# Patient Record
Sex: Male | Born: 1975 | Race: White | Hispanic: No | Marital: Married | Smoking: Former smoker
Health system: Southern US, Community
[De-identification: ages and names within clinical notes are randomized; demographics above are authoritative.]

## PROBLEM LIST (undated history)

## (undated) DIAGNOSIS — N419 Inflammatory disease of prostate, unspecified: Principal | ICD-10-CM

## (undated) HISTORY — DX: Inflammatory disease of prostate, unspecified: N41.9

---

## 2004-06-10 ENCOUNTER — Ambulatory Visit: Payer: Self-pay | Admitting: Internal Medicine

## 2005-01-06 ENCOUNTER — Ambulatory Visit: Payer: Self-pay | Admitting: Internal Medicine

## 2006-11-23 ENCOUNTER — Ambulatory Visit: Payer: Self-pay | Admitting: Internal Medicine

## 2006-11-23 LAB — CONVERTED CEMR LAB
Blood in Urine, dipstick: NEGATIVE
Glucose, Urine, Semiquant: NEGATIVE
Ketones, urine, test strip: NEGATIVE
Protein, U semiquant: NEGATIVE
Urobilinogen, UA: 0.2
WBC Urine, dipstick: NEGATIVE

## 2006-12-22 ENCOUNTER — Ambulatory Visit: Payer: Self-pay | Admitting: Internal Medicine

## 2006-12-22 LAB — CONVERTED CEMR LAB
Cholesterol: 194 mg/dL (ref 0–200)
HDL: 29.9 mg/dL — ABNORMAL LOW (ref >39.0)
Total CHOL/HDL Ratio: 6.5
Triglycerides: 172 mg/dL — ABNORMAL HIGH (ref 0–149)

## 2007-03-09 ENCOUNTER — Ambulatory Visit: Payer: Self-pay | Admitting: Internal Medicine

## 2007-03-19 ENCOUNTER — Telehealth: Payer: Self-pay | Admitting: Family Medicine

## 2007-05-20 HISTORY — PX: NASAL SINUS SURGERY: SHX719

## 2007-05-30 ENCOUNTER — Ambulatory Visit: Payer: Self-pay | Admitting: Otolaryngology

## 2007-07-30 ENCOUNTER — Ambulatory Visit: Payer: Self-pay | Admitting: Internal Medicine

## 2009-05-12 ENCOUNTER — Ambulatory Visit: Payer: Self-pay | Admitting: Internal Medicine

## 2009-05-12 LAB — CONVERTED CEMR LAB
Blood in Urine, dipstick: NEGATIVE
Ketones, urine, test strip: NEGATIVE
Nitrite: NEGATIVE
Protein, U semiquant: NEGATIVE
WBC Urine, dipstick: NEGATIVE

## 2009-11-30 ENCOUNTER — Ambulatory Visit: Payer: Self-pay | Admitting: Internal Medicine

## 2010-02-18 ENCOUNTER — Ambulatory Visit: Payer: Self-pay | Admitting: Family Medicine

## 2010-02-18 DIAGNOSIS — J069 Acute upper respiratory infection, unspecified: Secondary | ICD-10-CM | POA: Insufficient documentation

## 2010-04-20 NOTE — Assessment & Plan Note (Signed)
Summary: SINUS/DLO   Vital Signs:  Patient profile:   35 year old male Weight:      204.75 pounds BMI:     26.38 Temp:     98.2 degrees F oral Pulse rate:   100 / minute Pulse rhythm:   regular BP sitting:   124 / 76  (left arm) Cuff size:   large  Vitals Entered By: Sydell Axon LPN (February 18, 2010 2:51 PM) CC: Ears feel stopped up and head congestion   History of Present Illness: Pt here for congestiuon. Pt had chills yesyterday, didn't chjeck his temp. he has headache occipitally, some ear pain today bilaterally, no rhinitis, ST esp in Am, cough productive thick yellow...worst in the AM.  No N/V, sleeping poorly not from cough. He has taken Tyl, nothing else.   Problems Prior to Update: 1)  Sinusitis- Acute-nos  (ICD-461.9) 2)  Screening For Diabetes Mellitus  (ICD-V77.1) 3)  Screening For Lipoid Disorders  (ICD-V77.91) 4)  Examination, Routine Medical  (ICD-V70.0)  Medications Prior to Update: 1)  Bactrim Ds 800-160 Mg Tabs (Sulfamethoxazole-Trimethoprim) .... One By Mouth Two Times A Day X 10 Days 2)  Flonase 50 Mcg/act Susp (Fluticasone Propionate) .... 2 Squirts in Each Nostril Daily  Allergies: 1)  ! Augmentin  Physical Exam  General:  alert and normal appearance.  Mildly sunburned. Head:  Normocephalic and atraumatic without obvious abnormalities. No apparent alopecia or balding.  + max sinus tenderness Eyes:  Conjunctiva clear bilaterally.  Ears:  R ear normal and L ear normal.   Nose:  External nasal examination shows no deformity or inflammation. Nasal mucosa are pink and moist without lesions or exudates. Mouth:  + erythema pharynx, no exudates Neck:  supple, no masses, no thyromegaly, no carotid bruits, and no cervical lymphadenopathy.   Lungs:  Normal respiratory effort, chest expands symmetrically. Lungs are clear to auscultation, no crackles or wheezes. Heart:  normal rate, regular rhythm, no murmur, and no gallop.     Impression &  Recommendations:  Problem # 1:  URI (ICD-465.9) Assessment New  See instructions.  Instructed on symptomatic treatment. Call if symptoms persist or worsen.   Complete Medication List: 1)  Flonase 50 Mcg/act Susp (Fluticasone propionate) .... 2 squirts in each nostril daily 2)  Doxycycline Hyclate 100 Mg Caps (Doxycycline hyclate) .... One tab by mouth two times a day  Patient Instructions: 1)  Take Guaifenesin by [facility] or [facility] and getting MUCOUS RELIEF EXPECTORANT (400mg ), take 11/2 tabs by mouth AM and NOON. 2)  Drink lots of fluids anytime taking Guaifenesin.  3)  Take Tyl ES 2 tabs by mouth three times a day.  4)  Gargle with nwarm salt water every 1/2 hr for two days. 5)  If sxs worsen, take Doxycycline. Prescriptions: DOXYCYCLINE HYCLATE 100 MG CAPS (DOXYCYCLINE HYCLATE) one tab by mouth two times a day  #20 x 0   Entered and Authorized by:   Shaune Leeks MD   Signed by:   Shaune Leeks MD on 02/18/2010   Method used:   Print then Give to Patient   RxID:   [id number]    Orders Added: 1)  Est. Patient Level III [28413]    Current Allergies (reviewed today): ! AUGMENTIN

## 2010-04-20 NOTE — Assessment & Plan Note (Signed)
Summary: ?SINUS INFECTION/CLE   Vital Signs:  Patient profile:   35 year old male Weight:      196 pounds Temp:     98.8 degrees F oral Pulse rate:   84 / minute Pulse rhythm:   regular BP sitting:   120 / 80  (left arm) Cuff size:   large  Vitals Entered By: Selena Batten Dance CMA Duncan Dull) (November 30, 2009 4:24 PM) CC: ? sinus infection x2 weeks   History of Present Illness: CC: 2wk h/o sinus infx?  2wk h/o purulent discharge and coughing, small amt blood in mucous.  Coughing in AM.  Feeling feverish on and off.  Started as Charity fundraiser, congestion, ST.  Tried OTC pseudophedrine (didn't really help).  + tooth pain bilaterally.  + sinus pressure headache, muffled ears and worse with bending head.  + wife sick, 3yo starting to get sick.  No new pets.  + smoking 1ppd.   No abd pain, n/v/d.    Current Medications (verified): 1)  None  Allergies: 1)  ! Augmentin PMH-FH-SH reviewed for relevance  Review of Systems       per HPI  Physical Exam  General:  alert and normal appearance.  sunburn Head:  Normocephalic and atraumatic without obvious abnormalities. No apparent alopecia or balding.  + max tenderness Eyes:  PERRLA, EOMI, + conjunctiva injected Ears:  R ear normal and L ear normal.   Nose:  + purulent discharge Mouth:  + erythema pharynx, no exudates Neck:  supple, no masses, no thyromegaly, no carotid bruits, and no cervical lymphadenopathy.   Lungs:  normal respiratory effort and normal breath sounds.  coarse breath sounds Heart:  normal rate, regular rhythm, no murmur, and no gallop.   Pulses:  2+ rad pulses Extremities:  no edema   Impression & Recommendations:  Problem # 1:  SINUSITIS- ACUTE-NOS (ICD-461.9) Instructed on treatment. Call if symptoms persist or worsen. augmentin allergy - rash  His updated medication list for this problem includes:    Bactrim Ds 800-160 Mg Tabs (Sulfamethoxazole-trimethoprim) ..... One by mouth two times a day x 10 days    Flonase 50  Mcg/act Susp (Fluticasone propionate) .Marland Kitchen... 2 squirts in each nostril daily  Complete Medication List: 1)  Bactrim Ds 800-160 Mg Tabs (Sulfamethoxazole-trimethoprim) .... One by mouth two times a day x 10 days 2)  Flonase 50 Mcg/act Susp (Fluticasone propionate) .... 2 squirts in each nostril daily  Patient Instructions: 1)  Sinus infection. 2)  Take antibotics as prescribed. 3)  Nasal saline drops and nasal steroid to help with inflammation in sinuses. 4)  Call clinic with quesitons, pleasure to meet you today. Prescriptions: FLONASE 50 MCG/ACT SUSP (FLUTICASONE PROPIONATE) 2 squirts in each nostril daily  #1 x 1   Entered and Authorized by:   Eustaquio Boyden  MD   Signed by:   Eustaquio Boyden  MD on 11/30/2009   Method used:   [facility]       [address]       [place], [place]  [address]       Ph: [phone] or [phone]       Fax: [phone]   RxID:   [id number] BACTRIM DS 800-160 MG TABS (SULFAMETHOXAZOLE-TRIMETHOPRIM) one by mouth two times a day x 10 days  #20 x 0   Entered and Authorized by:   Eustaquio Boyden  MD   Signed by:   Eustaquio Boyden  MD on 11/30/2009   Method used:   [facility]        [facility] ([facility]ail[facility]       [address]       [place], [place]  [address]       Ph: [phone] or [phone]       Fax: [phone]   RxID:   [id number]   Prior Medications: Current Allergies (reviewed today): ! AUGMENTIN

## 2010-04-20 NOTE — Assessment & Plan Note (Signed)
Summary: CDL PHYSICAL/CLE   Vital Signs:  Patient profile:   35 year old male Height:      74 inches Weight:      201 pounds BMI:     25.90 Temp:     98.2 degrees F oral Pulse rate:   88 / minute Pulse rhythm:   regular BP sitting:   150 / 90  (left arm) Cuff size:   large  Vitals Entered By: Mervin Hack CMA Duncan Dull) (May 12, 2009 11:20 AM)  Serial Vital Signs/Assessments:  Time      Position  BP       Pulse  Resp  Temp     By           R Arm     140/84                         Cindee Salt MD  CC: CDL physical  Vision Screening:Left eye with correction: 20 / 25 Right eye with correction: 20 / 20 Both eyes with correction: 20 / 20        Vision Entered By: Mervin Hack CMA Duncan Dull) (May 12, 2009 11:21 AM)   History of Present Illness: Here for CDL exam no new concerns  Allergies: 1)  ! Augmentin  Past History:  Past medical, surgical, family and social histories (including risk factors) reviewed for relevance to current acute and chronic problems.  Past Medical History: Reviewed history from 11/23/2006 and no changes required. Unremarkable  Past Surgical History: Reviewed history from 07/30/2007 and no changes required. 3/09 Sinus surgery  Family History: Reviewed history from 09/07/2006 and no changes required. Parents healthy (divorced) 3 step-sisters DM in aunt Prostate cancer in [place] GF  Social History: Reviewed history from 07/30/2007 and no changes required. 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 Eyes:  Denies double vision and vision loss-1 eye. ENT:  Denies decreased hearing and ringing in ears. CV:  Denies chest pain or discomfort, difficulty breathing at night, difficulty breathing while lying down, fainting,  lightheadness, palpitations, and shortness of breath with exertion. Resp:  Denies cough and shortness of breath. GI:  Denies abdominal pain, change in bowel habits, dark tarry stools, indigestion, nausea, and vomiting. GU:  Denies erectile dysfunction, urinary frequency, and urinary hesitancy. MS:  Complains of joint pain; denies joint swelling; Occ wrist pain--relates to holding machinery he uses. Derm:  Denies lesion(s) and rash. Neuro:  Complains of tingling; denies headaches, numbness, and weakness; occ awakens with right arm tingling if he sleeps on it wrong. Psych:  Denies anxiety and depression. Heme:  Denies abnormal bruising and enlarge lymph nodes. Allergy:  Denies seasonal allergies and sneezing.  Physical Exam  General:  alert and normal appearance.   Eyes:  pupils equal, pupils round, pupils reactive to light, and no optic disk abnormalities.   Ears:  R ear normal and L ear normal.   Mouth:  no erythema and no exudates.   Neck:  supple, no masses, no thyromegaly, no carotid bruits, and no cervical lymphadenopathy.   Lungs:  normal respiratory effort and normal breath sounds.   Heart:  normal rate, regular rhythm, no murmur, and no gallop.   Abdomen:  soft, non-tender, and no inguinal hernia.   Genitalia:  varicocele bilat normal testes Msk:  no joint tenderness and no joint swelling.   Pulses:  normal in feet Extremities:  no edema Neurologic:  alert & oriented X3, strength normal in all extremities, and gait normal.   Skin:  no rashes and no suspicious lesions.   Multiple benign nevi Axillary Nodes:  No palpable lymphadenopathy Psych:  normally interactive, good eye contact, not anxious appearing, and not depressed appearing.     Impression & Recommendations:  Problem # 1:  EXAMINATION, ROUTINE MEDICAL (ICD-V70.0) Assessment Comment Only healthy repeat BP 140/84 CDL signed for 2 years not fasting---will check glucose again next time  discussed cigarette  cessation--suggested patch and lozenge/gum  Patient Instructions: 1)  Please schedule a follow-up appointment in 2 years.   Prior Medications: Current Allergies (reviewed today): ! AUGMENTIN  Laboratory Results   Urine Tests  Date/Time Received: May 12, 2009 11:30 AM Date/Time Reported: May 12, 2009 11:30 AM  Routine Urinalysis   Color: yellow Appearance: Hazy Glucose: negative   (Normal Range: Negative) Bilirubin: negative   (Normal Range: Negative) Ketone: negative   (Normal Range: Negative) Spec. Gravity: <1.005   (Normal Range: 1.003-1.035) Blood: negative   (Normal Range: Negative) pH: 8.0   (Normal Range: 5.0-8.0) Protein: negative   (Normal Range: Negative) Urobilinogen: 0.2   (Normal Range: 0-1) Nitrite: negative   (Normal Range: Negative) Leukocyte Esterace: negative   (Normal Range: Negative)       Prevention & Chronic Care Immunizations   Influenza vaccine: Not documented    Tetanus booster: 03/21/1998: Td    Pneumococcal vaccine: Not documented  Other Screening   Smoking status: current  (09/07/2006)  Lipids   Total Cholesterol: 194  (12/22/2006)   LDL: 130  (12/22/2006)   LDL Direct: Not documented   HDL: 29.9  (12/22/2006)   Triglycerides: 172  (12/22/2006)

## 2011-02-23 ENCOUNTER — Encounter: Payer: Self-pay | Admitting: Internal Medicine

## 2011-02-24 ENCOUNTER — Ambulatory Visit (INDEPENDENT_AMBULATORY_CARE_PROVIDER_SITE_OTHER): Payer: BC Managed Care – PPO | Admitting: Family Medicine

## 2011-02-24 ENCOUNTER — Encounter: Payer: Self-pay | Admitting: Family Medicine

## 2011-02-24 VITALS — BP 110/80 | HR 96 | Temp 98.6°F | Ht 74.0 in | Wt 208.8 lb

## 2011-02-24 DIAGNOSIS — R05 Cough: Secondary | ICD-10-CM

## 2011-02-24 DIAGNOSIS — R6889 Other general symptoms and signs: Secondary | ICD-10-CM

## 2011-02-24 DIAGNOSIS — R5381 Other malaise: Secondary | ICD-10-CM

## 2011-02-24 DIAGNOSIS — J111 Influenza due to unidentified influenza virus with other respiratory manifestations: Secondary | ICD-10-CM | POA: Insufficient documentation

## 2011-02-24 DIAGNOSIS — R059 Cough, unspecified: Secondary | ICD-10-CM

## 2011-02-24 DIAGNOSIS — R5383 Other fatigue: Secondary | ICD-10-CM

## 2011-02-24 NOTE — Patient Instructions (Signed)
I think you have flu like illness. Antibiotics are not needed for this.  Viral infections usually take 7-10 days to resolve.  The cough can last several weeks to go away. Use cheratussin for cough as needed. Push fluids and plenty of rest. Out of work until feeling better. Please return if you are not improving as expected, or if you have high fevers (>101.5) or difficulty swallowing or worsening productive cough. Call clinic with questions.  Good to see you today.

## 2011-02-24 NOTE — Assessment & Plan Note (Signed)
Flu like illness - out of work. Supportive care as per instructions. Update [place] if sxs not improving or red flags.

## 2011-02-24 NOTE — Progress Notes (Signed)
  Subjective:    Patient ID: Jeffrey Cross, male    DOB: Oct 25, 1975, 35 y.o.   MRN: [id number]  HPI CC: congestion  3d h/o myalgias, body aches, throbbing HA, fatigue.  + cough mild productive yellow sputum.  RN of clear mucous, congestion.    So far has tried ibuprofen and theraflu which didn't help.  Temp to 99.9 yesterday.  No abd pain, n/v/d, rashes.  Wife recently sick.  Smoking 1 1/2 ppd, has cut back some.  No asthma, COPD.  Review of Systems Per HPI    Objective:   Physical Exam  Nursing note and vitals reviewed. Constitutional: He appears well-developed and well-nourished. No distress.       Evidently congested  HENT:  Head: Normocephalic and atraumatic.  Right Ear: Hearing, tympanic membrane, external ear and ear canal normal.  Left Ear: Hearing, tympanic membrane, external ear and ear canal normal.  Nose: Nose normal. No mucosal edema or rhinorrhea. Right sinus exhibits no maxillary sinus tenderness and no frontal sinus tenderness. Left sinus exhibits no maxillary sinus tenderness and no frontal sinus tenderness.  Mouth/Throat: Uvula is midline, oropharynx is clear and moist and mucous membranes are normal. No oropharyngeal exudate, posterior oropharyngeal edema, posterior oropharyngeal erythema or tonsillar abscesses.       R>L TM congestion  Eyes: Conjunctivae and EOM are normal. Pupils are equal, round, and reactive to light. No scleral icterus.  Neck: Normal range of motion. Neck supple. No thyromegaly present.  Cardiovascular: Normal rate, regular rhythm, normal heart sounds and intact distal pulses.   No murmur heard. Pulmonary/Chest: Effort normal and breath sounds normal. No respiratory distress. He has no wheezes. He has no rales.  Lymphadenopathy:    He has no cervical adenopathy.  Skin: Skin is warm and dry. No rash noted.       Assessment & Plan:

## 2011-05-23 ENCOUNTER — Encounter: Payer: BC Managed Care – PPO | Admitting: Internal Medicine

## 2011-05-26 ENCOUNTER — Ambulatory Visit (INDEPENDENT_AMBULATORY_CARE_PROVIDER_SITE_OTHER): Payer: BC Managed Care – PPO | Admitting: Internal Medicine

## 2011-05-26 ENCOUNTER — Encounter: Payer: Self-pay | Admitting: Internal Medicine

## 2011-05-26 VITALS — BP 130/82 | HR 90 | Temp 98.6°F | Ht 74.0 in | Wt 216.0 lb

## 2011-05-26 DIAGNOSIS — J209 Acute bronchitis, unspecified: Secondary | ICD-10-CM

## 2011-05-26 DIAGNOSIS — Z0000 Encounter for general adult medical examination without abnormal findings: Secondary | ICD-10-CM | POA: Insufficient documentation

## 2011-05-26 DIAGNOSIS — Z3009 Encounter for other general counseling and advice on contraception: Secondary | ICD-10-CM

## 2011-05-26 LAB — POCT URINALYSIS DIPSTICK
Bilirubin, UA: NEGATIVE
Blood, UA: NEGATIVE
Glucose, UA: NEGATIVE
Nitrite, UA: NEGATIVE
Spec Grav, UA: <=1.005
Urobilinogen, UA: 0.2

## 2011-05-26 MED ORDER — AZITHROMYCIN 250 MG PO TABS: ORAL_TABLET | ORAL | Status: AC

## 2011-05-26 NOTE — Progress Notes (Signed)
Subjective:    Patient ID: Jeffrey Cross, male    DOB: Jul 20, 1975, 36 y.o.   MRN: [id number]  HPI Doing well Needs CDL Didn't realize he was gaining weight  Just got new contacts  Wants to stop smoking 1-1.5 PPD Gave info on 1-800 QUIT NOW Bad dreams with nicotine patch---discussed taking off at bedtime  No current outpatient prescriptions on file prior to visit.    Allergies  Allergen Reactions  . Augmentin     rash    Past Medical History  Diagnosis Date  . No pertinent past medical history     Past Surgical History  Procedure Date  . Nasal sinus surgery 3/09    Family History  Problem Relation Age of Onset  . Healthy Mother   . Healthy Father   . Diabetes      Aunt  . Prostate cancer Paternal Grandfather     History   Social History  . Marital Status: Married    Spouse Name: N/A    Number of Children: 2  . Years of Education: N/A   Occupational History  . Plumber    Social History Main Topics  . Smoking status: Current Everyday Smoker -- 1.5 packs/day    Types: Cigarettes  . Smokeless tobacco: Never Used  . Alcohol Use: Yes     Rare  . Drug Use: No  . Sexually Active: Not on file   Other Topics Concern  . Not on file   Social History Narrative   Married; 2 children--1 with wife and one with shared custody with the motherPlumber--needs CDL for overweight vehicle   Review of Systems  Constitutional: Positive for unexpected weight change. Negative for fatigue.       Wears seat belt  HENT: Negative for hearing loss, congestion, rhinorrhea, dental problem and tinnitus.        Regular with dentist   Eyes: Negative for visual disturbance.       No diplopia or unilateral vision loss  Respiratory: Positive for cough. Negative for chest tightness and shortness of breath.        Recent cough with bronchitis Over a week Yellow sputum  Cardiovascular: Negative for chest pain, palpitations and leg swelling.  Gastrointestinal: Negative for  nausea, vomiting, abdominal pain, constipation and blood in stool.       Occ heartburn--doesn't use meds  Genitourinary: Negative for dysuria, urgency, frequency and difficulty urinating.       No sexual problems  Musculoskeletal: Positive for back pain. Negative for joint swelling and arthralgias.       Occ mild back pain that is self limited  Skin: Negative for rash.       Gets subQ masses---?lipomas  Neurological: Negative for dizziness, syncope, weakness, light-headedness, numbness and headaches.       Some warm sensation on lateral right thigh (?meralgia paresthetica)  Hematological: Negative for adenopathy. Does not bruise/bleed easily.  Psychiatric/Behavioral: Negative for sleep disturbance and dysphoric mood. The patient is not nervous/anxious.        Objective:   Physical Exam  Constitutional: He is oriented to person, place, and time. He appears well-developed and well-nourished. No distress.  HENT:  Head: Normocephalic and atraumatic.  Right Ear: External ear normal.  Left Ear: External ear normal.  Mouth/Throat: Oropharynx is clear and moist. No oropharyngeal exudate.  Eyes: Conjunctivae and EOM are normal. Pupils are equal, round, and reactive to light.  Neck: Normal range of motion. Neck supple. No thyromegaly present.  Cardiovascular: Normal  rate, regular rhythm, normal heart sounds and intact distal pulses.  Exam reveals no gallop.   No murmur heard. Pulmonary/Chest: Effort normal and breath sounds normal. No respiratory distress. He has no wheezes. He has no rales.  Abdominal: Soft. There is no tenderness.  Musculoskeletal: He exhibits no edema and no tenderness.  Lymphadenopathy:    He has no cervical adenopathy.  Neurological: He is alert and oriented to person, place, and time.  Skin: No rash noted. No erythema.  Psychiatric: He has a normal mood and affect. His behavior is normal. Judgment and thought content normal.          Assessment & Plan:

## 2011-05-26 NOTE — Assessment & Plan Note (Signed)
Healthy CDL done Discussed fitness and weight Counseled on cigarette cessation

## 2011-05-26 NOTE — Assessment & Plan Note (Signed)
Persistent cough with purulent mucous Will treat with z-pak

## 2011-07-28 ENCOUNTER — Encounter: Payer: Self-pay | Admitting: Internal Medicine

## 2011-07-28 ENCOUNTER — Ambulatory Visit (INDEPENDENT_AMBULATORY_CARE_PROVIDER_SITE_OTHER): Payer: BC Managed Care – PPO | Admitting: Internal Medicine

## 2011-07-28 VITALS — BP 120/80 | HR 95 | Temp 98.0°F | Wt 205.0 lb

## 2011-07-28 DIAGNOSIS — K612 Anorectal abscess: Secondary | ICD-10-CM

## 2011-07-28 DIAGNOSIS — K611 Rectal abscess: Secondary | ICD-10-CM | POA: Insufficient documentation

## 2011-07-28 MED ORDER — CEPHALEXIN 500 MG PO TABS: 500.0000 mg | ORAL_TABLET | Freq: Four times a day (QID) | ORAL | Status: AC

## 2011-07-28 NOTE — Progress Notes (Signed)
  Subjective:    Patient ID: Jeffrey Cross, male    DOB: September 19, 1975, 36 y.o.   MRN: [id number]  HPI Having a lot of pain in rectal area Mildly 2 days ago then mostly yesterday  Had vasectomy Friday---that feels fine Called urologist---referred here  No lumps or masses No blood Constant pain Tuck's pads didn't help at all  Bowels have been regular but it is very painful Usually doesn't have trouble moving bowels  Ibuprofen not really helping  Using hand held excavator Lots of lifting on Monday  No current outpatient prescriptions on file prior to visit.    Allergies  Allergen Reactions  . Amoxicillin-Pot Clavulanate     rash    Past Medical History  Diagnosis Date  . No pertinent past medical history     Past Surgical History  Procedure Date  . Nasal sinus surgery 3/09    Family History  Problem Relation Age of Onset  . Healthy Mother   . Healthy Father   . Diabetes      Aunt  . Prostate cancer Paternal Grandfather     History   Social History  . Marital Status: Married    Spouse Name: N/A    Number of Children: 2  . Years of Education: N/A   Occupational History  . Plumber    Social History Main Topics  . Smoking status: Former Smoker -- 1.5 packs/day    Types: Cigarettes    Quit date: 06/28/2011  . Smokeless tobacco: Never Used  . Alcohol Use: Yes     Rare  . Drug Use: No  . Sexually Active: Not on file   Other Topics Concern  . Not on file   Social History Narrative   Married; 2 children--1 with wife and one with shared custody with the motherPlumber--needs CDL for overweight vehicle   Review of Systems Appetite is okay No nausea or vomiting    Objective:   Physical Exam  Constitutional: He appears well-developed and well-nourished. No distress.  Genitourinary:       Scrotum looks fine No tenderness or bruising  Indurated area on left side of rectum Some pus coming out Exquisitely tender          Assessment & Plan:

## 2011-07-28 NOTE — Assessment & Plan Note (Addendum)
Not sure how he got this May have had skin break when positioned for the vasectomy No evidence of fistula  Discussed hot tub tonight since starting to drain now Keflex

## 2011-09-19 DIAGNOSIS — N419 Inflammatory disease of prostate, unspecified: Secondary | ICD-10-CM

## 2011-09-19 HISTORY — DX: Inflammatory disease of prostate, unspecified: N41.9

## 2011-09-26 ENCOUNTER — Ambulatory Visit (INDEPENDENT_AMBULATORY_CARE_PROVIDER_SITE_OTHER): Payer: BC Managed Care – PPO | Admitting: Family Medicine

## 2011-09-26 ENCOUNTER — Encounter: Payer: Self-pay | Admitting: Family Medicine

## 2011-09-26 VITALS — BP 136/70 | HR 80 | Temp 98.1°F | Wt 208.0 lb

## 2011-09-26 DIAGNOSIS — N419 Inflammatory disease of prostate, unspecified: Secondary | ICD-10-CM | POA: Insufficient documentation

## 2011-09-26 DIAGNOSIS — R300 Dysuria: Secondary | ICD-10-CM

## 2011-09-26 LAB — POCT URINALYSIS DIPSTICK
Glucose, UA: NEGATIVE
Spec Grav, UA: >=1.03
Urobilinogen, UA: 0.2

## 2011-09-26 MED ORDER — SULFAMETHOXAZOLE-TRIMETHOPRIM 800-160 MG PO TABS: 1.0000 | ORAL_TABLET | Freq: Two times a day (BID) | ORAL | Status: AC

## 2011-09-26 NOTE — Assessment & Plan Note (Signed)
Exam, sxs, and UA/micro consistent with prostatitis. Discussed this as well as provided with handout. Treat with bactrim x 20d course. (avoid cipro given highly active work). UCx sent.

## 2011-09-26 NOTE — Patient Instructions (Signed)
I do think there's prostate infection Treat with bactrim twice daily for 20 days. Let us know if not improving after treatment.  Prostatitis Prostatitis is an inflammation (the body's way of reacting to injury and/or infection) of the prostate gland. The prostate gland is a male organ. The gland is about the size and shape of a walnut. The prostate is located just below the bladder. It produces semen, which is a fluid that helps nourish and transport sperm. Prostatitis is the most common urinary tract problem in men younger than age 72. There are 4 categories of prostatitis:  I - Acute bacterial prostatitis.   II - Chronic bacterial prostatitis.   III - Chronic prostatitis and chronic pelvic pain syndrome (CPPS).   Inflammatory.   Non inflammatory.   IV - Asymptomatic inflammatory prostatitis.  Acute and chronic bacterial prostatitis are problems with bacterial infections of the prostate. "Acute" infection is usually a one-time problem. "Chronic" bacterial prostatitis is a condition with recurrent infection. It is usually caused by the same germ(bacteria). CPPS has symptoms similar to prostate infection. However, no infection is actually found. This condition can cause problems of ongoing pain. Currently, it cannot be cured. Treatments are available and aimed at symptom control.  Asymptomatic inflammatory prostatitis has no symptoms. It is a condition where infection-fighting cells are found by chance in the urine. The diagnosis is made most often during an exam for other conditions. Other conditions could be infertility or a high level of PSA (prostate-specific antigen) in the blood. SYMPTOMS  Symptoms can vary depending upon the type of prostatitis that exists. There can also be overlap in symptoms. This can make diagnosis difficult. Symptoms: For Acute bacterial prostatitis  Painful urination.   Fever or chills.   Muscle or joint pains.   Low back pain.   Low abdominal pain.    Inability to empty bladder completely.   Sudden urges to urinate.   Frequent urination during the day.   Difficulty starting urine stream.   Need to urinate several times at night (nocturia).   Weak urine stream.   Urethral (tube that carries urine from the bladder out of the body) discharge and dribbling after urination.  For Chronic bacterial prostatitis  Rectal pain.   Pain in the testicles, penis, or tip of the penis.   Pain in the space between the anus and scrotum (perineum).   Low back pain.   Low abdominal pain.   Problems with sexual function.   Painful ejaculation.   Bloody semen.   Inability to empty bladder completely.   Painful urination.   Sudden urges to urinate.   Frequent urination during the day.   Difficulty starting urine stream.   Need to urinate several times at night (nocturia).   Weak urine stream.   Dribbling after urination.   Urethral discharge.  For Chronic prostatitis and chronic pelvic pain syndrome (CPPS) Symptoms are the same as those for chronic bacterial prostatitis. Problems with sexual function are often the reason for seeking care. This important problem should be discussed with your caregiver. For Asymptomatic inflammatory prostatitis As noted above, there are no symptoms with this condition. DIAGNOSIS   Your caregiver may perform a rectal exam. This exam is to determine if the prostate is swollen and tender.   Sometimes blood work is performed. This is done to see if your white blood cell count is elevated. The Prostate Specific Antigen (PSA) is also measured. PSA is a blood test that can help detect early prostate  cancer.   A urinalysis is done to find out what type of infection is present if this is a suspected cause. An additional urinalysis may be done after a digital rectal exam. This is to see if white blood cells are pushed out of the prostate and into the urine. A low-grade infection of the prostate may not  be found on the first urinalysis.  In more difficult cases, your caregiver may advise other tests. Tests could include:  Urodynamics -- Tests the function of the bladder and the organs involved in triggering and controlling normal urination.   Urine flow rate.   Cystoscopy -- In this procedure, a thin, telescope-like tube with a light and tiny camera attached (cystoscope) is inserted into the bladder through the urethra. This allows the caregiver to see the inside of the urethra and bladder.   Electromyography -- This procedure tests how the muscles and nerves of the bladder work. It is focused on the muscles that control the anus and pelvic floor. These are the muscles between the anus and scrotum.  In people who show no signs of infection, certain uncommon infections might be causing constant or recurrent symptoms. These uncommon infections are difficult to detect. More work in medicine may help find solutions to these problems. TREATMENT  Antibiotics are used to treat infections caused by germs. If the infection is not treated and becomes long lasting (chronic), it may become a lower grade infection with minor, continual problems. Without treatment, the prostate may develop a boil or furuncle (abscess). This may require surgical treatment. For those with chronic prostatitis and CPPS, it is important to work closely with your primary caregiver and urologist. For some, the medicines that are used to treat a non-cancerous, enlarged prostate (benign prostatic hypertrophy) may be helpful. Referrals to specialists other than urologists may be necessary. In rare cases when all treatments have been inadequate for pain control, an operation to remove the prostate may be recommended. This is very rare and before this is considered thorough discussion with your urologist is highly recommended.  In cases of secondary to chronic non-bacterial prostatitis, a good relationship with your urologist or primary  caregiver is essential because it is often a recurrent prolonged condition that requires a good understanding of the causes and a commitment to therapy aimed at controlling your symptoms. HOME CARE INSTRUCTIONS   Hot sitz baths for 20 minutes, 4 times per day, may help relieve pain.   Non-prescription pain killers may be used as your caregiver recommends if you have no allergies to them. Some illnesses or conditions prevent use of non-prescription drugs. If unsure, check with your caregiver. Take all medications as directed. Take the antibiotics for the prescribed length of time, even if you are feeling better.  SEEK MEDICAL CARE IF:   You have any worsening of the symptoms that originally brought you to your caregiver.   You have an oral temperature above 102 F (38.9 C).   You experience any side effects from medications prescribed.  SEEK IMMEDIATE MEDICAL CARE IF:   You have an oral temperature above 102 F (38.9 C), not controlled by medicine.   You have pain not relieved with medications.   You develop nausea, vomiting, lightheadedness, or have a fainting episode.   You are unable to urinate.   You pass bloody urine or clots.  Document Released: 03/04/2000 Document Revised: 02/24/2011 Document Reviewed: 02/07/2011 [facility] Patient Information 2012 [place], [place].

## 2011-09-26 NOTE — Progress Notes (Signed)
  Subjective:    Patient ID: Jeffrey Cross, male    DOB: 02-Apr-1975, 36 y.o.   MRN: [id number]  HPI CC: ?UTI  3d h/o dysuria, trouble with stream.  Some polyuria, some retention.  First night with chills, night sweats, malaise.  Drank a lot of iced tea prior to sxs starting.  Took 4 keflex pills had left over.  No urgency.  No hematuria.  Some lower back pain and leg pain.  Has never had prostate infection in past.  Denies abd pain, n/v, flank pain.  No urethral discharge.  No new sexual partners.    Recent perirectal abscess 07/2011 treated with keflex, drained on own.  Resolved.    Had vasectomy 06/2011 by urology.  Past Medical History  Diagnosis Date  . No pertinent past medical history     Review of Systems Per HPI    Objective:   Physical Exam  Nursing note and vitals reviewed. Constitutional: He appears well-developed and well-nourished. No distress.  Abdominal: Soft. Bowel sounds are normal. He exhibits no distension. There is no hepatosplenomegaly. There is no tenderness. There is no rebound, no guarding and no CVA tenderness.  Genitourinary: Rectum normal. Rectal exam shows no external hemorrhoid, no internal hemorrhoid, no fissure, no mass, no tenderness and anal tone normal. Prostate is enlarged (slightly boggy) and tender.      Assessment & Plan:

## 2011-09-29 LAB — URINE CULTURE: Colony Count: >=100000

## 2011-11-18 ENCOUNTER — Encounter: Payer: Self-pay | Admitting: Family Medicine

## 2011-11-18 ENCOUNTER — Ambulatory Visit (INDEPENDENT_AMBULATORY_CARE_PROVIDER_SITE_OTHER): Payer: BC Managed Care – PPO | Admitting: Family Medicine

## 2011-11-18 VITALS — BP 126/88 | HR 78 | Temp 98.3°F | Wt 209.8 lb

## 2011-11-18 DIAGNOSIS — H6590 Unspecified nonsuppurative otitis media, unspecified ear: Secondary | ICD-10-CM

## 2011-11-18 MED ORDER — AZITHROMYCIN 250 MG PO TABS: ORAL_TABLET | ORAL | Status: AC

## 2011-11-18 MED ORDER — FLUTICASONE PROPIONATE 50 MCG/ACT NA SUSP: 2.0000 | Freq: Every day | NASAL | Status: DC

## 2011-11-18 NOTE — Progress Notes (Signed)
  Subjective:    Patient ID: Jeffrey Cross, male    DOB: 1975/09/24, 36 y.o.   MRN: [id number]  HPI CC: R ear pain  sxs ongoing for last 2-3 days.  "R ear closed up".  Recently getting over cold.  Still mild cough productive of green sputum.    Mild pain at right jaw.  + R tinnitus.  No pain, fevers/chills.  Has tried ibuprofen OTC.  No recent swimming.  No discharge from ears.  Review of Systems Per HPI    Objective:   Physical Exam  Nursing note and vitals reviewed. Constitutional: He appears well-developed and well-nourished. No distress.  HENT:  Head: Normocephalic and atraumatic.  Right Ear: Hearing, external ear and ear canal normal.  Left Ear: Hearing, tympanic membrane, external ear and ear canal normal.  Nose: Mucosal edema present. No rhinorrhea.  Mouth/Throat: Uvula is midline, oropharynx is clear and moist and mucous membranes are normal. No oropharyngeal exudate, posterior oropharyngeal edema, posterior oropharyngeal erythema or tonsillar abscesses.       yellow fluid behind R TM, poor mobility with insufflation, telangectasia and erythema of TM.  Eyes: Conjunctivae and EOM are normal. Pupils are equal, round, and reactive to light. No scleral icterus.  Neck: Normal range of motion. Neck supple.  Lymphadenopathy:    He has no cervical adenopathy.       Assessment & Plan:

## 2011-11-18 NOTE — Assessment & Plan Note (Signed)
With eustachian tube dysfunction on right side after viral URTI. Supportive care with flonase, nasal saline. If not improving or worsening, fill zpack. Discussed if not improved in 3-4 wks, return for re eval, consideration of ENT.

## 2011-11-18 NOTE — Patient Instructions (Signed)
You have serous otitis - treat with nasal saline and nasal steroid. Fill zpack if not improving or any worsening. Push fluids and rest.  Serous Otitis Media  Serous otitis media is also known as otitis media with effusion (OME). It means there is fluid in the middle ear space. This space contains the bones for hearing and air. Air in the middle ear space helps to transmit sound.  The air gets there through the eustachian tube. This tube goes from the back of the throat to the middle ear space. It keeps the pressure in the middle ear the same as the outside world. It also helps to drain fluid from the middle ear space. CAUSES  OME occurs when the eustachian tube gets blocked. Blockage can come from:  Ear infections.   Colds and other upper respiratory infections.   Allergies.   Irritants such as cigarette smoke.   Sudden changes in air pressure (such as descending in an airplane).   Enlarged adenoids.  During colds and upper respiratory infections, the middle ear space can become temporarily filled with fluid. This can happen after an ear infection also. Once the infection clears, the fluid will generally drain out of the ear through the eustachian tube. If it does not, then OME occurs. SYMPTOMS   Hearing loss.   A feeling of fullness in the ear - but no pain.   Young children may not show any symptoms.  DIAGNOSIS   Diagnosis of OME is made by an ear exam.   Tests may be done to check on the movement of the eardrum.   Hearing exams may be done.  TREATMENT   The fluid most often goes away without treatment.   If allergy is the cause, allergy treatment may be helpful.   Fluid that persists for several months may require minor surgery. A small tube is placed in the ear drum to:   Drain the fluid.   Restore the air in the middle ear space.   In certain situations, antibiotics are used to avoid surgery.   Surgery may be done to remove enlarged adenoids (if this is the  cause).  HOME CARE INSTRUCTIONS   Keep children away from tobacco smoke.   Be sure to keep follow up appointments, if any.  SEEK MEDICAL CARE IF:   Hearing is not better in 3 months.   Hearing is worse.   Ear pain.   Drainage from the ear.   Dizziness.  Document Released: 05/28/2003 Document Revised: 02/24/2011 Document Reviewed: 03/27/2008 [facility] Patient Information 2012 [place], [place].

## 2012-02-27 ENCOUNTER — Encounter: Payer: Self-pay | Admitting: Internal Medicine

## 2012-02-27 ENCOUNTER — Ambulatory Visit (INDEPENDENT_AMBULATORY_CARE_PROVIDER_SITE_OTHER): Payer: BC Managed Care – PPO | Admitting: Internal Medicine

## 2012-02-27 VITALS — BP 120/80 | HR 75 | Temp 98.5°F | Wt 215.0 lb

## 2012-02-27 DIAGNOSIS — J019 Acute sinusitis, unspecified: Secondary | ICD-10-CM

## 2012-02-27 MED ORDER — CEFUROXIME AXETIL 500 MG PO TABS: 500.0000 mg | ORAL_TABLET | Freq: Two times a day (BID) | ORAL | Status: DC

## 2012-02-27 NOTE — Assessment & Plan Note (Signed)
Started as mostly bronchitis (and still some rhonchi) The ear symptoms and apparent PND indicate sinus involvement---still hard to tell if this is bacterial Discussed supportive care Antibiotics if worses

## 2012-02-27 NOTE — Patient Instructions (Signed)
Please start the antibiotic if you are worsening in the next few days. 

## 2012-02-27 NOTE — Progress Notes (Signed)
  Subjective:    Patient ID: Jeffrey Cross, male    DOB: [birth year], 35 y.o.   MRN: [id number]  HPI Still gets occ blood on the toilet paper No blood in stool No dysuria No pain or pus  Having chest cold for a week Dry cough which is persistent Felt real bad last week---fever for 2 days Now coughing more Does have yellow sputum AM and night Affecting voice No fever now No SOB No headache Some nasal congestion on right in AM--then clears ?some post nasal drip Congested feeling in ears  Ibuprofen may be helping some  No current outpatient prescriptions on file prior to visit.    Allergies  Allergen Reactions  . Amoxicillin-Pot Clavulanate     rash    Past Medical History  Diagnosis Date  . Prostatitis 09/2011    Past Surgical History  Procedure Date  . Nasal sinus surgery 3/09    Family History  Problem Relation Age of Onset  . Healthy Mother   . Healthy Father   . Diabetes      Aunt  . Prostate cancer Paternal Grandfather     History   Social History  . Marital Status: Married    Spouse Name: N/A    Number of Children: 2  . Years of Education: N/A   Occupational History  . Plumber    Social History Main Topics  . Smoking status: Former Smoker -- 1.5 packs/day    Types: Cigarettes    Quit date: 06/28/2011  . Smokeless tobacco: Never Used     Comment: using electronic cigarettes  . Alcohol Use: Yes     Comment: Rare  . Drug Use: No  . Sexually Active: Not on file   Other Topics Concern  . Not on file   Social History Narrative   Married; 2 children--1 with wife and one with shared custody with the motherPlumber--needs CDL for overweight vehicle   Review of Systems No reash No vomiting or diarrhea Some nausea last week Appetite okay now     Objective:   Physical Exam  Constitutional: He appears well-developed and well-nourished. No distress.  HENT:  Mouth/Throat: Oropharynx is clear and moist. No oropharyngeal exudate.       No  sinus tenderness Mild nasal inflammation TMs normal  Neck: Normal range of motion. Neck supple. No thyromegaly present.  Pulmonary/Chest: Effort normal. No respiratory distress. He has no wheezes. He has no rales.       Slight rhonchi  Lymphadenopathy:    He has no cervical adenopathy.          Assessment & Plan:

## 2012-09-27 ENCOUNTER — Ambulatory Visit (INDEPENDENT_AMBULATORY_CARE_PROVIDER_SITE_OTHER): Payer: BC Managed Care – PPO | Admitting: Internal Medicine

## 2012-09-27 ENCOUNTER — Encounter: Payer: Self-pay | Admitting: Internal Medicine

## 2012-09-27 VITALS — BP 140/90 | HR 73 | Temp 98.4°F | Wt 212.0 lb

## 2012-09-27 DIAGNOSIS — J019 Acute sinusitis, unspecified: Secondary | ICD-10-CM

## 2012-09-27 MED ORDER — AZITHROMYCIN 250 MG PO TABS: ORAL_TABLET | ORAL | Status: DC

## 2012-09-27 NOTE — Assessment & Plan Note (Signed)
Sick for 3 weeks Presumed secondary bacterial infection Intolerant of augmentin and recently ceftin Will try z-pak If not effective, change to clinda

## 2012-09-27 NOTE — Progress Notes (Signed)
  Subjective:    Patient ID: Jeffrey Cross, male    DOB: 16-Aug-1975, 37 y.o.   MRN: [id number]  HPI Very congested for 3 weeks Head and chest Ears are full---"like I am in the mountains" Cough--lots of yellow sputum (he feels like it is from chest) Yellow nasal discharge  No fever No sweats, chills or shakes No SOB---but notices it more in heat Hasn't missed work  Has used ibuprofen--not clearly helpful No other meds No current outpatient prescriptions on file prior to visit.   No current facility-administered medications on file prior to visit.    Allergies  Allergen Reactions  . Amoxicillin-Pot Clavulanate     rash    Past Medical History  Diagnosis Date  . Prostatitis 09/2011    Past Surgical History  Procedure Laterality Date  . Nasal sinus surgery  3/09    Family History  Problem Relation Age of Onset  . Healthy Mother   . Healthy Father   . Diabetes      Aunt  . Prostate cancer Paternal Grandfather     History   Social History  . Marital Status: Married    Spouse Name: N/A    Number of Children: 2  . Years of Education: N/A   Occupational History  . Plumber    Social History Main Topics  . Smoking status: Former Smoker -- 1.50 packs/day    Types: Cigarettes    Quit date: 06/28/2011  . Smokeless tobacco: Never Used     Comment: using electronic cigarettes  . Alcohol Use: Yes     Comment: Rare  . Drug Use: No  . Sexually Active: Not on file   Other Topics Concern  . Not on file   Social History Narrative   Married; 2 children--1 with wife and one with shared custody with the mother      Plumber--needs CDL for overweight vehicle            Review of Systems No rash No vomiting or diarrhea Appetite is off but able to eat     Objective:   Physical Exam  Constitutional: He appears well-developed and well-nourished. No distress.  HENT:  Mouth/Throat: Oropharynx is clear and moist. No oropharyngeal exudate.  Mild pharyngeal  injection TMs normal No sinus tenderness Moderate nasal inflammation  Neck: Normal range of motion. Neck supple.  Pulmonary/Chest: Effort normal and breath sounds normal. No respiratory distress. He has no wheezes. He has no rales.  Lymphadenopathy:    He has no cervical adenopathy.          Assessment & Plan:

## 2012-09-27 NOTE — Patient Instructions (Signed)
Please call next week if you are not feeling better

## 2012-10-08 ENCOUNTER — Telehealth: Payer: Self-pay

## 2012-10-08 MED ORDER — CLINDAMYCIN HCL 300 MG PO CAPS: 300.0000 mg | ORAL_CAPSULE | Freq: Three times a day (TID) | ORAL | Status: DC

## 2012-10-08 NOTE — Telephone Encounter (Signed)
rx sent to pharmacy by e-script Spoke with patient and advised results   

## 2012-10-08 NOTE — Telephone Encounter (Signed)
Pt seen 09/27/12; pt finished Z pack on 10/03/12 and cannot tell any improvement; still prod cough with yellow phlegm, head and chest congestion, slight SOB, no fever; [facility].

## 2012-10-08 NOTE — Telephone Encounter (Signed)
Please cal him I know that z-pack isn't that great against sinus at times Please send Rx for clindamycin 300mg  tid  #30 x 0 If he is not improving by the end of the week, I probably need to see him again

## 2013-06-07 ENCOUNTER — Ambulatory Visit: Payer: Self-pay | Admitting: Physician Assistant

## 2013-06-07 VITALS — BP 130/80 | HR 70 | Temp 98.2°F | Resp 16 | Ht 73.5 in | Wt 214.0 lb

## 2013-06-07 DIAGNOSIS — Z0289 Encounter for other administrative examinations: Secondary | ICD-10-CM

## 2013-06-07 NOTE — Progress Notes (Signed)
U/A SpGr-1.005 Protein- Neg Blood- Neg Glucose- Neg

## 2013-06-07 NOTE — Progress Notes (Signed)
   Subjective:    Patient ID: Jeffrey Cross, male    DOB: 1975-05-03, 38 y.o.   MRN: [id number]  HPI    Mr. 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  He reports he is in good health.  He denies medical problems or medication use.  Surgical hx positive only for vasectomy.  He does wear contact lenses - sees eye doctor q 6 months.  He denies hx HTN, DM, heart disease, lung disease, seizure, syncope    Review of Systems  Constitutional: Negative.   HENT: Negative.   Respiratory: Negative.   Cardiovascular: Negative.   Gastrointestinal: Negative.   Musculoskeletal: Negative.        Objective:   Physical Exam  Vitals reviewed. Constitutional: He is oriented to person, place, and time. He appears well-developed and well-nourished. No distress.  HENT:  Head: Normocephalic and atraumatic.  Right Ear: Tympanic membrane and ear canal normal.  Left Ear: Tympanic membrane and ear canal normal.  Mouth/Throat: Uvula is midline and oropharynx is clear and moist.  Eyes: Conjunctivae and EOM are normal. Pupils are equal, round, and reactive to light. No scleral icterus.  Neck: Normal range of motion. Neck supple.  Cardiovascular: Normal rate, regular rhythm, normal heart sounds and intact distal pulses.   Pulmonary/Chest: Effort normal and breath sounds normal. He has no wheezes. He has no rales.  Abdominal: Soft. Bowel sounds are normal. There is no tenderness. Hernia confirmed negative in the right inguinal area and confirmed negative in the left inguinal area.  Musculoskeletal: Normal range of motion. He exhibits no edema.  Lymphadenopathy:    He has no cervical adenopathy.  Neurological: He is alert and oriented to person, place, and time. He has normal reflexes.  Skin: Skin is warm and dry.  Psychiatric: He has a normal mood and affect. His behavior is normal.       Assessment & Plan:  Health examination of defined subpopulation   Mr. Jeffrey Cross is a very pleasant 38  yr old male here for DOT exam.  He reports good health and exam is normal today.  Ok to certify for 2 yrs.  Card and ppw completed.  RTC as needs arise    E. Frances FurbishElizabeth Seena Ritacco MHS, PA-C [facility] 3/20/20153:59 PM

## 2013-12-17 ENCOUNTER — Ambulatory Visit (INDEPENDENT_AMBULATORY_CARE_PROVIDER_SITE_OTHER): Payer: BC Managed Care – PPO | Admitting: Internal Medicine

## 2013-12-17 ENCOUNTER — Encounter: Payer: Self-pay | Admitting: Internal Medicine

## 2013-12-17 VITALS — BP 138/88 | HR 84 | Temp 98.7°F | Resp 14 | Wt 210.5 lb

## 2013-12-17 DIAGNOSIS — Z23 Encounter for immunization: Secondary | ICD-10-CM

## 2013-12-17 DIAGNOSIS — N529 Male erectile dysfunction, unspecified: Secondary | ICD-10-CM | POA: Insufficient documentation

## 2013-12-17 DIAGNOSIS — N528 Other male erectile dysfunction: Secondary | ICD-10-CM

## 2013-12-17 LAB — COMPREHENSIVE METABOLIC PANEL
ALT: 39 U/L (ref 0–53)
AST: 29 U/L (ref 0–37)
Albumin: 4.5 g/dL (ref 3.5–5.2)
Alkaline Phosphatase: 48 U/L (ref 39–117)
BUN: 13 mg/dL (ref 6–23)
CALCIUM: 9.7 mg/dL (ref 8.4–10.5)
CHLORIDE: 103 meq/L (ref 96–112)
CO2: 29 mEq/L (ref 19–32)
CREATININE: 0.9 mg/dL (ref 0.4–1.5)
GFR: 103.51 mL/min (ref >60.00)
Glucose, Bld: 92 mg/dL (ref 70–99)
POTASSIUM: 4.1 meq/L (ref 3.5–5.1)
Sodium: 137 mEq/L (ref 135–145)
Total Bilirubin: 0.5 mg/dL (ref 0.2–1.2)
Total Protein: 8 g/dL (ref 6.0–8.3)

## 2013-12-17 LAB — CBC WITH DIFFERENTIAL/PLATELET
BASOS ABS: 0 10*3/uL (ref 0.0–0.1)
Basophils Relative: 0.2 % (ref 0.0–3.0)
EOS ABS: 0.2 10*3/uL (ref 0.0–0.7)
Eosinophils Relative: 2.2 % (ref 0.0–5.0)
HCT: 45.4 % (ref 39.0–52.0)
HEMOGLOBIN: 15.5 g/dL (ref 13.0–17.0)
LYMPHS ABS: 2.1 10*3/uL (ref 0.7–4.0)
LYMPHS PCT: 29.9 % (ref 12.0–46.0)
MCHC: 34.2 g/dL (ref 30.0–36.0)
MCV: 87.5 fl (ref 78.0–100.0)
MONO ABS: 0.7 10*3/uL (ref 0.1–1.0)
Monocytes Relative: 9.4 % (ref 3.0–12.0)
NEUTROS ABS: 4.1 10*3/uL (ref 1.4–7.7)
Neutrophils Relative %: 58.3 % (ref 43.0–77.0)
Platelets: 283 10*3/uL (ref 150.0–400.0)
RBC: 5.18 Mil/uL (ref 4.22–5.81)
RDW: 13 % (ref 11.5–15.5)
WBC: 6.9 10*3/uL (ref 4.0–10.5)

## 2013-12-17 LAB — T4, FREE: Free T4: 0.88 ng/dL (ref 0.60–1.60)

## 2013-12-17 LAB — LIPID PANEL
Cholesterol: 215 mg/dL — ABNORMAL HIGH (ref 0–200)
HDL: 37.7 mg/dL — ABNORMAL LOW (ref >39.00)
NONHDL: 177.3
TRIGLYCERIDES: 255 mg/dL — AB (ref 0.0–149.0)
Total CHOL/HDL Ratio: 6
VLDL: 51 mg/dL — ABNORMAL HIGH (ref 0.0–40.0)

## 2013-12-17 LAB — LDL CHOLESTEROL, DIRECT: Direct LDL: 134.6 mg/dL

## 2013-12-17 MED ORDER — SILDENAFIL CITRATE 20 MG PO TABS: 60.0000 mg | ORAL_TABLET | Freq: Every day | ORAL | Status: DC | PRN

## 2013-12-17 NOTE — Progress Notes (Signed)
Pre visit review using our clinic review tool, if applicable. No additional management support is needed unless otherwise documented below in the visit note. 

## 2013-12-17 NOTE — Assessment & Plan Note (Signed)
He is concerned about underlying problem but he feels well, weight stable, good energy levels, no urinary symptoms Will just check labs Trial sildenafil

## 2013-12-17 NOTE — Progress Notes (Signed)
   Subjective:    Patient ID: Jeffrey Cross, male    DOB: 1975-10-17, 38 y.o.   MRN: [id number]  HPI Has been noticing trouble getting and maintaining erections Barely able to penetrate now Did try some herbal preparation without effect  Marriage is fine He is not initiating sex as often Doesn't think his wife minds but is worried about a medical issue  No urinary problem No penile discharge No infidelity  No current outpatient prescriptions on file prior to visit.   No current facility-administered medications on file prior to visit.    Allergies  Allergen Reactions  . Amoxicillin-Pot Clavulanate     rash    Past Medical History  Diagnosis Date  . Prostatitis 09/2011    Past Surgical History  Procedure Laterality Date  . Nasal sinus surgery  3/09    Family History  Problem Relation Age of Onset  . Healthy Mother   . Healthy Father   . Diabetes      Aunt  . Prostate cancer Paternal Grandfather     History   Social History  . Marital Status: Married    Spouse Name: N/A    Number of Children: 2  . Years of Education: N/A   Occupational History  . Plumber    Social History Main Topics  . Smoking status: Former Smoker -- 1.50 packs/day    Types: Cigarettes    Quit date: 06/28/2011  . Smokeless tobacco: Never Used     Comment: using electronic cigarettes  . Alcohol Use: Yes     Comment: Rare  . Drug Use: No  . Sexual Activity: Not on file   Other Topics Concern  . Not on file   Social History Narrative   Married; 2 children--1 with wife and one with shared custody with the mother      Plumber--needs CDL for overweight vehicle            Review of Systems Sleeping fine Appetite is good    Objective:   Physical Exam  Constitutional: He appears well-developed and well-nourished. No distress.  Genitourinary:  Testes normal Left varicocele  Normal urethra          Assessment & Plan:

## 2014-03-11 ENCOUNTER — Encounter: Payer: Self-pay | Admitting: Internal Medicine

## 2014-03-11 ENCOUNTER — Telehealth: Payer: Self-pay | Admitting: *Deleted

## 2014-03-11 ENCOUNTER — Ambulatory Visit (INDEPENDENT_AMBULATORY_CARE_PROVIDER_SITE_OTHER): Payer: BC Managed Care – PPO | Admitting: Internal Medicine

## 2014-03-11 VITALS — BP 130/90 | HR 89 | Temp 98.0°F | Wt 212.0 lb

## 2014-03-11 DIAGNOSIS — J0100 Acute maxillary sinusitis, unspecified: Secondary | ICD-10-CM

## 2014-03-11 DIAGNOSIS — N529 Male erectile dysfunction, unspecified: Secondary | ICD-10-CM

## 2014-03-11 MED ORDER — VARDENAFIL HCL 20 MG PO TABS: 20.0000 mg | ORAL_TABLET | Freq: Every day | ORAL | Status: DC | PRN

## 2014-03-11 MED ORDER — AMOXICILLIN 500 MG PO TABS: 1000.0000 mg | ORAL_TABLET | Freq: Two times a day (BID) | ORAL | Status: DC

## 2014-03-11 NOTE — Assessment & Plan Note (Signed)
Problems with sildenafil Will try vardenafil Consider checking testosterone levels

## 2014-03-11 NOTE — Progress Notes (Signed)
   Subjective:    Patient ID: Jeffrey Cross, male    DOB: December 09, 1975, 38 y.o.   MRN: [id number]  HPI Here for persistent respiratory illness  Has been sick since Thanksgiving Off an on Yellow nasal drainage and sputum with cough  No clear fever Has been hot at night--but no sweats No SOB Hasn't missed work  Some sore throat last week Some headache No ear pain  Just taking ibuprofen  Current Outpatient Prescriptions on File Prior to Visit  Medication Sig Dispense Refill  . sildenafil (REVATIO) 20 MG tablet Take 3-5 tablets (60-100 mg total) by mouth daily as needed. 50 tablet 11   No current facility-administered medications on file prior to visit.    Allergies  Allergen Reactions  . Amoxicillin-Pot Clavulanate     rash    Past Medical History  Diagnosis Date  . Prostatitis 09/2011    Past Surgical History  Procedure Laterality Date  . Nasal sinus surgery  3/09    Family History  Problem Relation Age of Onset  . Healthy Mother   . Healthy Father   . Diabetes      Aunt  . Prostate cancer Paternal Grandfather     History   Social History  . Marital Status: Married    Spouse Name: N/A    Number of Children: 2  . Years of Education: N/A   Occupational History  . Plumber    Social History Main Topics  . Smoking status: Former Smoker -- 1.50 packs/day    Types: Cigarettes    Quit date: 06/28/2011  . Smokeless tobacco: Never Used     Comment: using electronic cigarettes  . Alcohol Use: Yes     Comment: Rare  . Drug Use: No  . Sexual Activity: Not on file   Other Topics Concern  . Not on file   Social History Narrative   Married; 2 children--1 with wife and one with shared custody with the mother      Plumber--needs CDL for overweight vehicle            Review of Systems Had original success with sildenafil but now not and "it makes me feel like crap" No rash Some diarrhea 2 days ago No vomiting Appetite okay    Objective:   Physical Exam  Constitutional: He appears well-developed and well-nourished. No distress.  HENT:  No sinus tenderness TMs normal Slight pharyngeal injection Moderate nasal inflammation  Neck: Normal range of motion. Neck supple. No thyromegaly present.  Pulmonary/Chest: Effort normal and breath sounds normal. No respiratory distress. He has no wheezes. He has no rales.  Lymphadenopathy:    He has no cervical adenopathy.          Assessment & Plan:

## 2014-03-11 NOTE — Telephone Encounter (Signed)
Received fax request asking for prior auth for Levitra 20mg , please advise ok to proceed with prior auth?

## 2014-03-11 NOTE — Progress Notes (Signed)
Pre visit review using our clinic review tool, if applicable. No additional management support is needed unless otherwise documented below in the visit note. 

## 2014-03-11 NOTE — Assessment & Plan Note (Signed)
Symptoms for over 3 weeks Will try amoxicillin (review of augmentin "allergy" disclosed rash on arms but it was after working on pollen filled bushes---doesn't seem like an allergy to the med) Continue ibuprofen prn

## 2014-03-12 NOTE — Telephone Encounter (Signed)
I guess so Not sure what they will ask for but we can try

## 2014-03-13 NOTE — Telephone Encounter (Signed)
Prior auth sent to covermy meds

## 2014-03-27 NOTE — Telephone Encounter (Signed)
Prior Berkley Harveyauth was denied for Levitra, pt must try Viagra not Revatio. Please advise

## 2014-03-27 NOTE — Telephone Encounter (Signed)
I did send appeal to cover my meds to see if they will cover first.

## 2014-03-27 NOTE — Telephone Encounter (Signed)
But he had side effects with sildenafil (which is viagra) It is contraindicated to try viagra since he had side effects!!  Let him know we are having trouble with the insurance May need to prescribe him some viagra even if he doesn't take it---then tell them again that he got sick on it (headache or something) Go ahead and send #5 x 1 if he wants to do this  They are not going to cover for BPH

## 2014-03-31 MED ORDER — AMOXICILLIN-POT CLAVULANATE 875-125 MG PO TABS: 1.0000 | ORAL_TABLET | Freq: Two times a day (BID) | ORAL | Status: DC

## 2014-03-31 NOTE — Telephone Encounter (Signed)
Spoke with patient and advised results   

## 2014-03-31 NOTE — Telephone Encounter (Signed)
Let him know I sent a stronger antibiotic--augmentin  I did request the levitra---he was just taking for ED (someone else was the person I was trying to get cialis)

## 2014-03-31 NOTE — Addendum Note (Signed)
Addended by: Sueanne MargaritaSMITH, DESHANNON L on: 03/31/2014 02:07 PM   Modules accepted: Orders, Medications

## 2014-03-31 NOTE — Telephone Encounter (Addendum)
Spoke with patient and advised that rx has been approved.  Pt also states that he is still suffering with sinus infection and would like to try a different or maybe the same abx. Please advise

## 2014-03-31 NOTE — Addendum Note (Signed)
Addended by: Tillman AbideLETVAK, Rameses Ou I on: 03/31/2014 12:54 PM   Modules accepted: Orders

## 2015-01-26 ENCOUNTER — Other Ambulatory Visit: Payer: Self-pay | Admitting: Internal Medicine

## 2015-05-05 ENCOUNTER — Ambulatory Visit
Admission: RE | Admit: 2015-05-05 | Discharge: 2015-05-05 | Disposition: A | Discharge status: Home / Self Care | Payer: BLUE CROSS/BLUE SHIELD | Source: Ambulatory Visit | Attending: Family Medicine | Admitting: Family Medicine

## 2015-05-05 ENCOUNTER — Encounter: Payer: Self-pay | Admitting: Family Medicine

## 2015-05-05 ENCOUNTER — Ambulatory Visit (INDEPENDENT_AMBULATORY_CARE_PROVIDER_SITE_OTHER): Payer: BLUE CROSS/BLUE SHIELD | Admitting: Family Medicine

## 2015-05-05 VITALS — BP 130/90 | HR 88 | Temp 98.3°F | Wt 223.2 lb

## 2015-05-05 DIAGNOSIS — I861 Scrotal varices: Secondary | ICD-10-CM | POA: Diagnosis not present

## 2015-05-05 DIAGNOSIS — N5089 Other specified disorders of the male genital organs: Secondary | ICD-10-CM | POA: Insufficient documentation

## 2015-05-05 DIAGNOSIS — Z23 Encounter for immunization: Secondary | ICD-10-CM

## 2015-05-05 DIAGNOSIS — N50819 Testicular pain, unspecified: Secondary | ICD-10-CM | POA: Diagnosis not present

## 2015-05-05 DIAGNOSIS — N433 Hydrocele, unspecified: Secondary | ICD-10-CM | POA: Diagnosis not present

## 2015-05-05 LAB — POC URINALSYSI DIPSTICK (AUTOMATED)
BILIRUBIN UA: NEGATIVE
Blood, UA: NEGATIVE
GLUCOSE UA: NEGATIVE
Ketones, UA: NEGATIVE
LEUKOCYTES UA: NEGATIVE
NITRITE UA: NEGATIVE
PH UA: 6.5
Protein, UA: NEGATIVE
Spec Grav, UA: 1.025
Urobilinogen, UA: 0.2

## 2015-05-05 MED ORDER — NAPROXEN 500 MG PO TABS: ORAL_TABLET | ORAL | Status: DC

## 2015-05-05 MED ORDER — CIPROFLOXACIN HCL 500 MG PO TABS: 500.0000 mg | ORAL_TABLET | Freq: Two times a day (BID) | ORAL | Status: DC

## 2015-05-05 NOTE — Progress Notes (Signed)
Pre visit review using our clinic review tool, if applicable. No additional management support is needed unless otherwise documented below in the visit note. 

## 2015-05-05 NOTE — Addendum Note (Signed)
Addended by: Eustaquio Boyden on: 05/05/2015 05:38 PM   Modules accepted: Orders

## 2015-05-05 NOTE — Progress Notes (Addendum)
BP 130/90 mmHg  Pulse 88  Temp(Src) 98.3 F (36.8 C) (Oral)  Wt 223 lb 4 oz (101.266 kg)   CC: L testicle pain  Subjective:    Patient ID: Jeffrey Cross, male    DOB: 01/09/1976, 40 y.o.   MRN: [id number]  HPI: Jeffrey Cross is a 40 y.o. male presenting on 05/05/2015 for Testicle Pain   Several week h/o dull scrotal pain, acutely worse over last 2 days and localized to left side. Possible swelling.   Recent prolonged car ride prior to discomfort starting.  Denies inciting trauma/injury.  No recent bicycling.   No fevers/chills, ab pain, dysuria, urgency, urethral discharge. No rectal or lower back pain. No new rashes or skin lesions.  So far has tried ibuprofen for this without improvement.  Denies new sexual partners.  H/o prostatitis from chart - pt does not remember this.   Relevant past medical, surgical, family and social history reviewed and updated as indicated. Interim medical history since our last visit reviewed. Allergies and medications reviewed and updated. Current Outpatient Prescriptions on File Prior to Visit  Medication Sig  . sildenafil (REVATIO) 20 MG tablet TAKE 3-5 TABELTS BY MOUTH ONCE A DAY AS NEEDED   No current facility-administered medications on file prior to visit.    Review of Systems Per HPI unless specifically indicated in ROS section     Objective:    BP 130/90 mmHg  Pulse 88  Temp(Src) 98.3 F (36.8 C) (Oral)  Wt 223 lb 4 oz (101.266 kg)  Wt Readings from Last 3 Encounters:  05/05/15 223 lb 4 oz (101.266 kg)  03/11/14 212 lb (96.163 kg)  12/17/13 210 lb 8 oz (95.482 kg)    Physical Exam  Constitutional: He appears well-developed and well-nourished. No distress.  Abdominal: Soft. Bowel sounds are normal. He exhibits no distension and no mass. There is no tenderness. There is no rebound and no guarding. Hernia confirmed negative in the right inguinal area and confirmed negative in the left inguinal area.  Genitourinary: Penis  normal. Right testis shows no mass, no swelling and no tenderness. Right testis is descended. Left testis shows mass, swelling and tenderness. Left testis is descended.  R posterior testicle very painful to palpation with swelling around epididymis  Musculoskeletal: He exhibits no edema.  Lymphadenopathy:       Right: No inguinal adenopathy present.       Left: No inguinal adenopathy present.  Psychiatric: He has a normal mood and affect.  Nursing note and vitals reviewed.  Results for orders placed or performed in visit on 05/05/15  POCT Urinalysis Dipstick (Automated)  Result Value Ref Range   Color, UA Yellow    Clarity, UA Clear    Glucose, UA Negative    Bilirubin, UA Negative    Ketones, UA Negative    Spec Grav, UA 1.025    Blood, UA Negative    pH, UA 6.5    Protein, UA Negative    Urobilinogen, UA 0.2    Nitrite, UA Negative    Leukocytes, UA Negative Negative   SCROTAL ULTRASOUND DOPPLER ULTRASOUND OF THE TESTICLES IMPRESSION: Small hydroceles bilaterally. Small left-sided varicocele. No intratesticular or extratesticular mass appreciable on either side. No inflammatory lesions apparent. No demonstrable testicular torsion on either side. Electronically Signed  By: Bretta Bang III M.D.  On: 05/05/2015 17:19    Assessment & Plan:   Problem List Items Addressed This Visit    Testicle pain - Primary  UA today normal. Send for UCx.  Given acute presentation concern for torsion - check stat US/doppler. Will call patient with results. Pt agrees with plan.  ADDENDUM ==> received call re scrotal US impression: no inflammation or torsion or mass. bilat hydroceles and L varicocele. Discussed with patient - rec treat with empiric antibiotic, NSAID, elevation of scrotum when seated. Update if not improved with this. Pt agrees.      Relevant Orders   POCT Urinalysis Dipstick (Automated) (Completed)   US Scrotum (Completed)   [place] Art/Ven Flow Abd Pelv Doppler  (Completed)   Urine culture    Other Visit Diagnoses    Need for influenza vaccination        Relevant Orders    Flu Vaccine QUAD 36+ mos PF IM (Fluarix & Fluzone Quad PF) (Completed)        Follow up plan: Return if symptoms worsen or fail to improve.

## 2015-05-05 NOTE — Patient Instructions (Addendum)
Flu shot today. Pass by Marion's office to schedule testicular ultrasound today if able.

## 2015-05-05 NOTE — Assessment & Plan Note (Addendum)
UA today normal. Send for UCx.  Given acute presentation concern for torsion - check stat US/doppler. Will call patient with results. Pt agrees with plan.  ADDENDUM ==> received call re scrotal US impression: no inflammation or torsion or mass. bilat hydroceles and L varicocele. Discussed with patient - rec treat with empiric antibiotic, NSAID, elevation of scrotum when seated. Update if not improved with this. Pt agrees.

## 2015-05-06 ENCOUNTER — Ambulatory Visit: Payer: Self-pay | Admitting: Internal Medicine

## 2015-05-07 LAB — URINE CULTURE
Colony Count: NO GROWTH
Organism ID, Bacteria: NO GROWTH

## 2015-06-04 ENCOUNTER — Ambulatory Visit (INDEPENDENT_AMBULATORY_CARE_PROVIDER_SITE_OTHER): Payer: Self-pay | Admitting: Family Medicine

## 2015-06-04 VITALS — BP 126/84 | HR 93 | Temp 98.7°F | Resp 18 | Ht 73.5 in | Wt 225.0 lb

## 2015-06-04 DIAGNOSIS — Z024 Encounter for examination for driving license: Secondary | ICD-10-CM

## 2015-06-04 DIAGNOSIS — Z021 Encounter for pre-employment examination: Secondary | ICD-10-CM

## 2015-06-04 NOTE — Patient Instructions (Signed)
Return in 2 years or sooner as needed

## 2015-06-04 NOTE — Progress Notes (Signed)
Patient ID: Jeffrey DeutscherLevi A Shamblin, male    DOB: 19-May-1975  Age: 40 y.o. MRN: [id number]  Chief Complaint  Patient presents with  . Employment Physical    DOT PE    Subjective:   Physical examination  Patient is here for his DOT physical examination. He is 40 years old.Wears contacts. No other complaints.  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 Married with 2 kids  Review of systems: Constitutional: Unremarkable HEENT: Wears contacts, otherwise unremarkable Cardiovascular: Unremarkable Respiratory: Unremarkable GI: Unremarkable GU: Unremarkable Dermatologic: Has a little cyst on his right cheek and has lipomas on his abdomen Neurologic: Unremarkable Psychiatric: Unremarkable Endocrinologic: Unremarkable     Current allergies, medications, problem list, past/family and social histories reviewed.  Objective:  BP 126/84 mmHg  Pulse 93  Temp(Src) 98.7 F (37.1 C) (Oral)  Resp 18  Ht 6' 1.5" (1.867 m)  Wt 225 lb (102.059 kg)  BMI 29.28 kg/m2  SpO2 98%  Physical exam: Healthy-appearing man in no acute distress. TMs dull. Eyes PERRLA. Fundi benign. Throat clear and teeth good. Neck supple without significant nodes. Chest is clear to auscultation. Heart regular without murmurs, gallops, or arrhythmias. Abdomen soft without mass or tenderness. Normal male external genitalia, testes descended. No hernias. Extremities unremarkable. 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 He has a small cystic area right cheek just along the line of his beard. Noninflamed. Spine appears normal. The skeletal unremarkable.  Assessment & Plan:   Assessment: 1. Driver's permit physical examination       Plan: Normal DOT card for 2 years.  No orders of the defined types were placed in this encounter.    No orders of the defined types  were placed in this encounter.         There are no Patient Instructions on file for this visit.   No Follow-up on file.   HOPPER,DAVID, MD 06/04/2015

## 2016-02-18 ENCOUNTER — Other Ambulatory Visit: Payer: Self-pay | Admitting: Internal Medicine

## 2016-04-06 ENCOUNTER — Ambulatory Visit: Payer: BLUE CROSS/BLUE SHIELD | Admitting: Primary Care

## 2016-08-02 ENCOUNTER — Ambulatory Visit (INDEPENDENT_AMBULATORY_CARE_PROVIDER_SITE_OTHER): Payer: BLUE CROSS/BLUE SHIELD | Admitting: Family Medicine

## 2016-08-02 ENCOUNTER — Encounter: Payer: Self-pay | Admitting: Family Medicine

## 2016-08-02 VITALS — BP 124/82 | HR 87 | Temp 98.4°F | Wt 221.0 lb

## 2016-08-02 DIAGNOSIS — J0110 Acute frontal sinusitis, unspecified: Secondary | ICD-10-CM

## 2016-08-02 MED ORDER — AZITHROMYCIN 250 MG PO TABS: ORAL_TABLET | ORAL | 0 refills | Status: DC

## 2016-08-02 NOTE — Progress Notes (Signed)
SUBJECTIVE:  Jeffrey Cross is a 41 y.o. male who complains of coryza, congestion, sneezing, dry cough and bilateral sinus pain for 18 days. He denies a history of anorexia and chest pain and denies a history of asthma. Patient denies smoke cigarettes.   Current Outpatient Prescriptions on File Prior to Visit  Medication Sig Dispense Refill  . naproxen (NAPROSYN) 500 MG tablet Take one po bid x 1 week then prn pain, take with food 40 tablet 0  . sildenafil (REVATIO) 20 MG tablet TAKE 3 TO 5 TABLETS BY MOUTH DAILY AS NEEDED 50 tablet 1   No current facility-administered medications on file prior to visit.     Allergies  Allergen Reactions  . Augmentin [Amoxicillin-Pot Clavulanate] Hives    rash    Past Medical History:  Diagnosis Date  . Prostatitis 09/2011    Past Surgical History:  Procedure Laterality Date  . NASAL SINUS SURGERY  3/09    Family History  Problem Relation Age of Onset  . Healthy Mother   . Healthy Father   . Diabetes Unknown        Aunt  . Prostate cancer Paternal Grandfather     Social History   Social History  . Marital status: Married    Spouse name: N/A  . Number of children: 2  . Years of education: N/A   Occupational History  . Plumber    Social History Main Topics  . Smoking status: Former Smoker    Packs/day: 1.50    Types: Cigarettes    Quit date: 06/28/2011  . Smokeless tobacco: Never Used     Comment: using electronic cigarettes  . Alcohol use Yes     Comment: Rare  . Drug use: No  . Sexual activity: Not on file   Other Topics Concern  . Not on file   Social History Narrative   Married; 2 children--1 with wife and one with shared custody with the mother      Plumber--needs CDL for overweight vehicle            The PMH, PSH, Social History, Family History, Medications, and allergies [facility], and have been updated if relevant.  OBJECTIVE: BP 124/82   Pulse 87   Temp 98.4 F (36.9 C)   Wt 221 lb  (100.2 kg)   SpO2 98%   BMI 28.76 kg/m   He appears well, vital signs are as noted. Ears normal.  Throat and pharynx normal.  Neck supple. No adenopathy in the neck. Nose is congested. Sinuses tender. The chest is clear, without wheezes or rales.  ASSESSMENT:  sinusitis  PLAN: Given duration and progression of symptoms, will treat for bacterial sinusitis with zpack. Symptomatic therapy suggested: push fluids, rest and return office visit prn if symptoms persist or worsen.  Call or return to clinic prn if these symptoms worsen or fail to improve as anticipated.

## 2016-08-04 DIAGNOSIS — H5213 Myopia, bilateral: Secondary | ICD-10-CM | POA: Diagnosis not present

## 2016-08-04 DIAGNOSIS — H52222 Regular astigmatism, left eye: Secondary | ICD-10-CM | POA: Diagnosis not present

## 2016-09-20 ENCOUNTER — Encounter: Payer: Self-pay | Admitting: Internal Medicine

## 2016-09-20 ENCOUNTER — Ambulatory Visit (INDEPENDENT_AMBULATORY_CARE_PROVIDER_SITE_OTHER): Payer: BLUE CROSS/BLUE SHIELD | Admitting: Internal Medicine

## 2016-09-20 VITALS — BP 130/92 | HR 79 | Temp 98.4°F | Wt 221.8 lb

## 2016-09-20 DIAGNOSIS — R21 Rash and other nonspecific skin eruption: Secondary | ICD-10-CM | POA: Diagnosis not present

## 2016-09-20 DIAGNOSIS — S81852A Open bite, left lower leg, initial encounter: Secondary | ICD-10-CM | POA: Diagnosis not present

## 2016-09-20 DIAGNOSIS — L089 Local infection of the skin and subcutaneous tissue, unspecified: Secondary | ICD-10-CM | POA: Diagnosis not present

## 2016-09-20 MED ORDER — DOXYCYCLINE HYCLATE 100 MG PO TABS: 100.0000 mg | ORAL_TABLET | Freq: Two times a day (BID) | ORAL | 0 refills | Status: DC

## 2016-09-20 NOTE — Patient Instructions (Signed)
Take  antibiotic incase infection is involved .  Local care gentle protection  If  persistent or progressive then plan fu visit with PCP or other .

## 2016-09-20 NOTE — Progress Notes (Signed)
Chief Complaint  Patient presents with  . Insect Bite    HPI: Jeffrey Cross 41 y.o.  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 It could be near the day before but is not sure. Over the last couple days it is become sore underneath and around the knee but no limitation of motion fever or systemic symptoms. He thought maybe something bit him but doesn't remember that. Not particularly itchy more sore like a burn. Or a sunburn feeling. Never had something like this generally well. ROS: See pertinent positives and negatives per HPI. No fever chills other rash   Past Medical History:  Diagnosis Date  . Prostatitis 09/2011    Family History  Problem Relation Age of Onset  . Healthy Mother   . Healthy Father   . Diabetes Unknown        Aunt  . Prostate cancer Paternal Grandfather     Social History   Social History  . Marital status: Married    Spouse name: N/A  . Number of children: 2  . Years of education: N/A   Occupational History  . Plumber    Social History Main Topics  . Smoking status: Former Smoker    Packs/day: 1.50    Types: Cigarettes    Quit date: 06/28/2011  . Smokeless tobacco: Never Used     Comment: using electronic cigarettes  . Alcohol use Yes     Comment: Rare  . Drug use: No  . Sexual activity: Not Asked   Other Topics Concern  . None   Social History Narrative   Married; 2 children--1 with wife and one with shared custody with the mother      Plumber--needs CDL for overweight vehicle             Outpatient Medications Prior to Visit  Medication Sig Dispense Refill  . sildenafil (REVATIO) 20 MG tablet TAKE 3 TO 5 TABLETS BY MOUTH DAILY AS NEEDED 50 tablet 1  . azithromycin (ZITHROMAX) 250 MG tablet 2 tabs by mouth on day 1, followed by 1 tab by mouth daily days 2-5 6 tablet 0  . naproxen  (NAPROSYN) 500 MG tablet Take one po bid x 1 week then prn pain, take with food 40 tablet 0   No facility-administered medications prior to visit.      EXAM:  BP (!) 130/92 (BP Location: Right Arm, Patient Position: Sitting, Cuff Size: Normal)   Pulse 79   Temp 98.4 F (36.9 C) (Oral)   Wt 221 lb 12.8 oz (100.6 kg)   BMI 28.87 kg/m   Body mass index is 28.87 kg/m.  GENERAL: vitals reviewed and listed above, alert, oriented, appears well hydrated and in no acute distress HEENT: atraumatic, conjunctiva  clear, no obvious abnormalities on inspection of external nose and ears  :MS: moves all extremities without noticeable focal  Abnormality Rash ovoid with a little bit of central clearing in the middle.on the left lateral knee without induration or fluctuance. The rash is almost vesicular but no umbilication confluence and ovoid. There is some faint erythema superior no effusion of the knee good range of motion. No other rashes. PSYCH: pleasant and cooperative, no obvious depression or anxiety    ASSESSMENT AND PLAN:  Discussed the following assessment and plan:  Rash soreness  Bite of left lower leg with infection, initial encounter  ?  Curious rash  With out easlily identifiable cause   Works under h ouses  In [employer] more like a contact burn irritation etc. Cover with antibiotic unknown source of this painful rash does not look like shingles or herpetic. Gentle care observation and follow-up protection to the area when he works.  Risk benefit of medication discussed.   Expectant management.  -Patient advised to return or notify health care team  if symptoms worsen ,persist or new concerns arise.  Patient Instructions  Take  antibiotic incase infection is involved .  Local care gentle protection  If  persistent or progressive then plan fu visit with PCP or other .      Neta Mends. Kaetlin Bullen M.D.

## 2017-01-16 ENCOUNTER — Other Ambulatory Visit: Payer: Self-pay | Admitting: Internal Medicine

## 2017-04-25 IMAGING — US US ART/VEN ABD/PELV/SCROTUM DOPPLER LTD
1 series · 13 of 25 positions shown · non-contrast
Comparison: None.

CLINICAL DATA: Three-day history of left-sided scrotal swelling

EXAM:
SCROTAL ULTRASOUND
DOPPLER ULTRASOUND OF THE TESTICLES
TECHNIQUE: Complete ultrasound examination of the testicles, epididymis, and
other scrotal structures was performed. Color and spectral Doppler
ultrasound were also utilized to evaluate blood flow to the
testicles.

[Series 1: us art/ven abd/pelv/scrotum doppler ltd · 0.08mm/px · 13 of 63 slices shown]
[im 1/63]
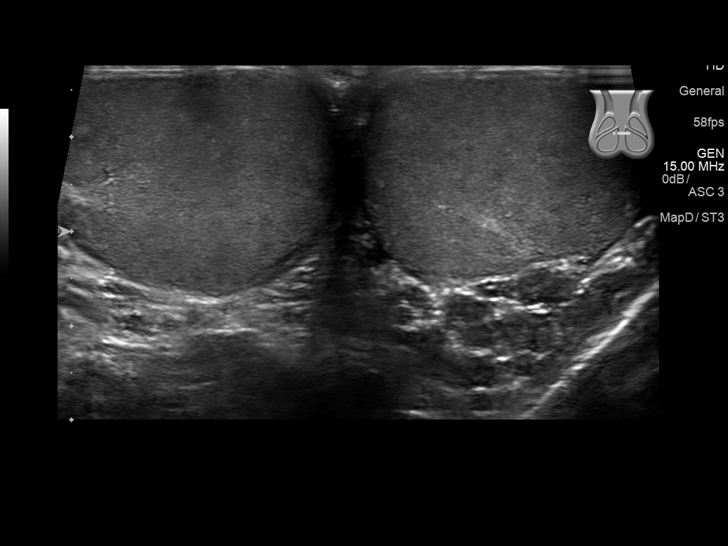
[im 6/63]
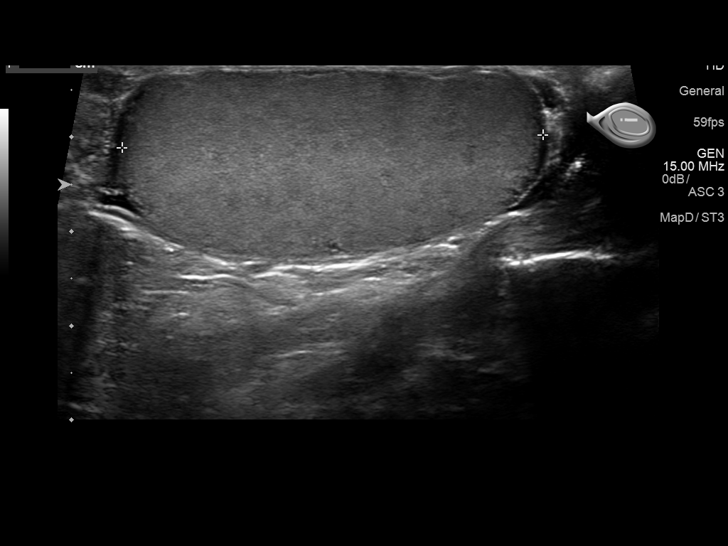
[im 11/63]
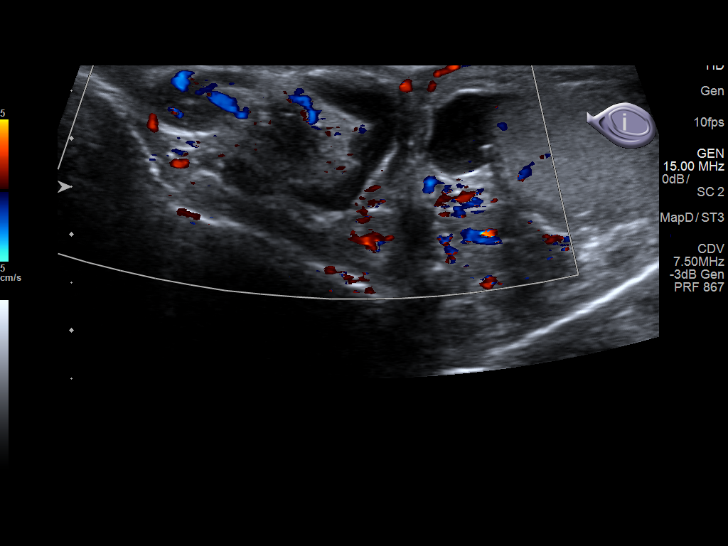
[im 16/63]
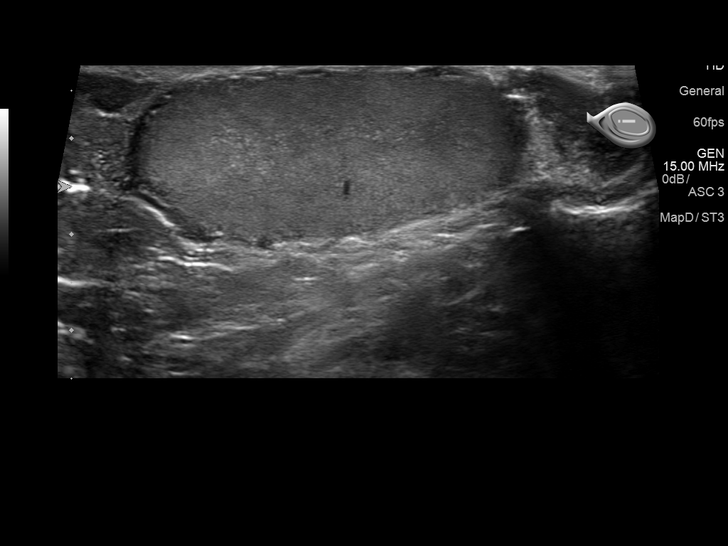
[im 21/63]
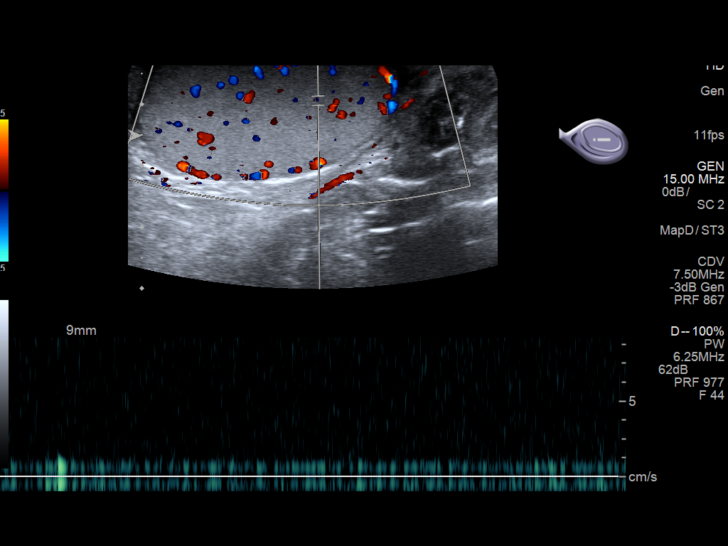
[im 26/63]
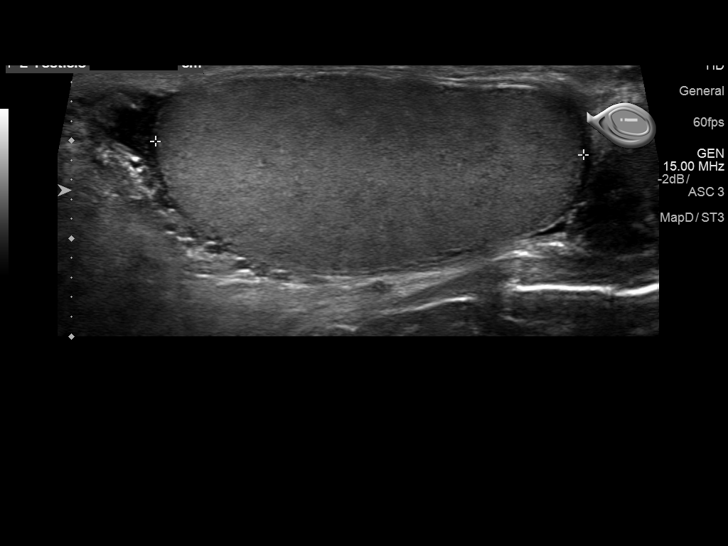
[im 32/63]
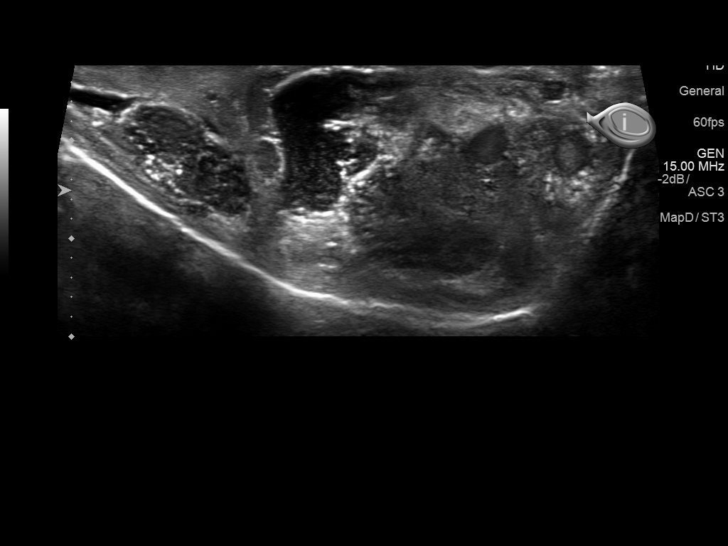
[im 37/63]
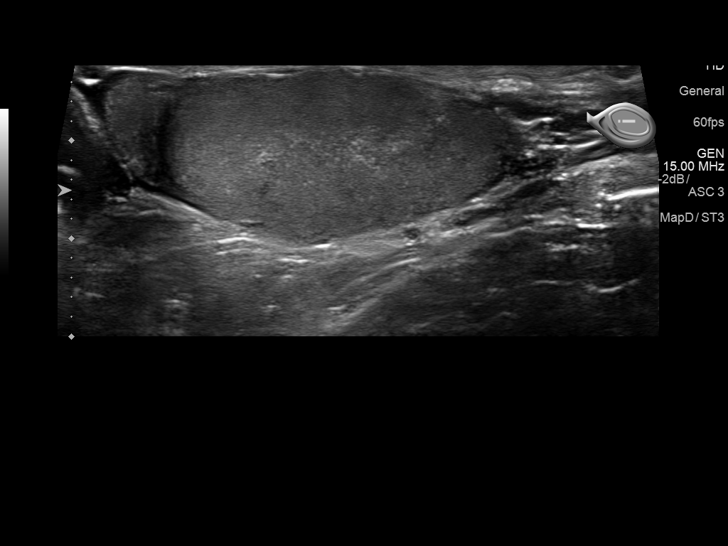
[im 42/63]
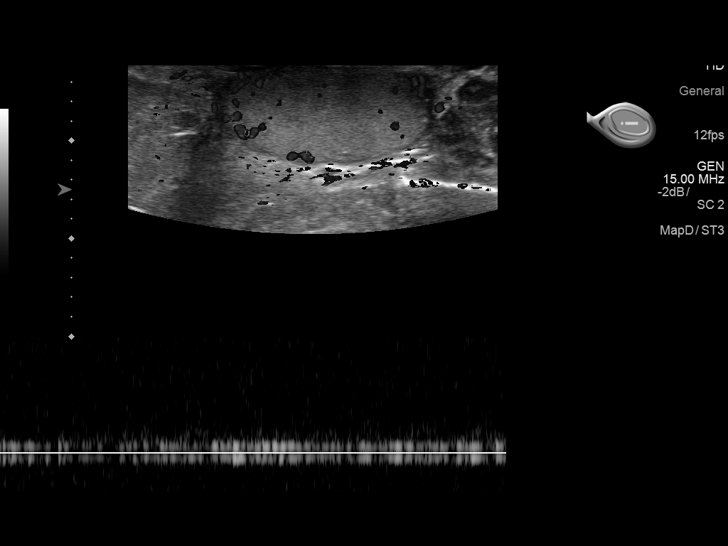
[im 47/63]
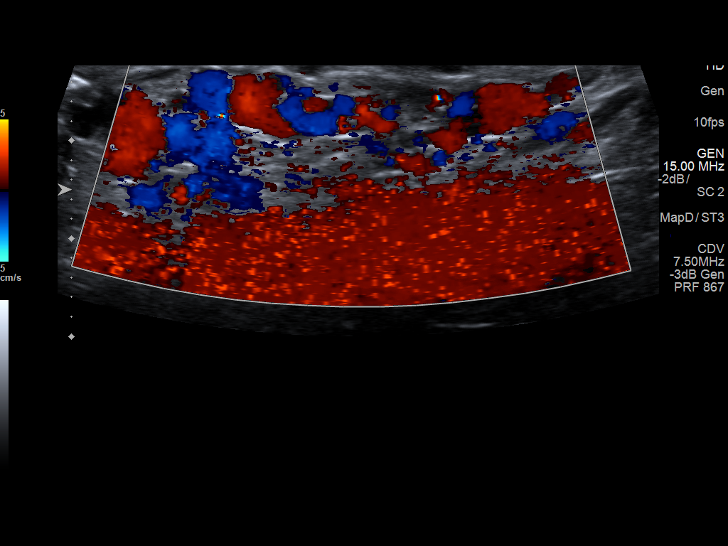
[im 52/63]
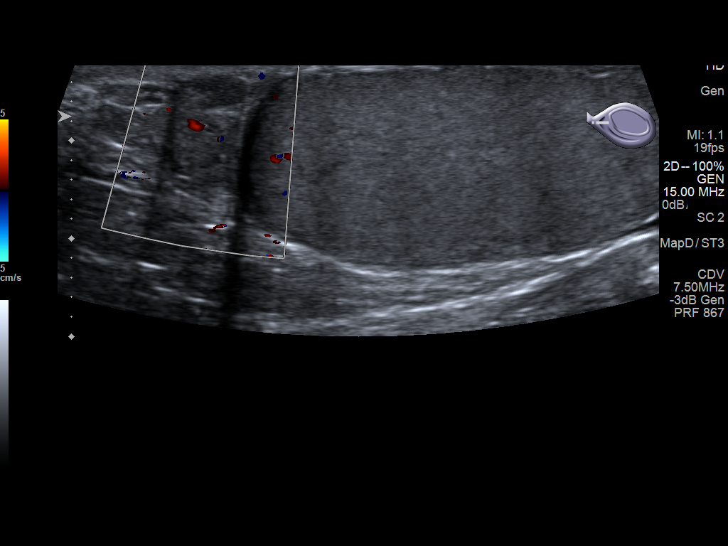
[im 57/63]
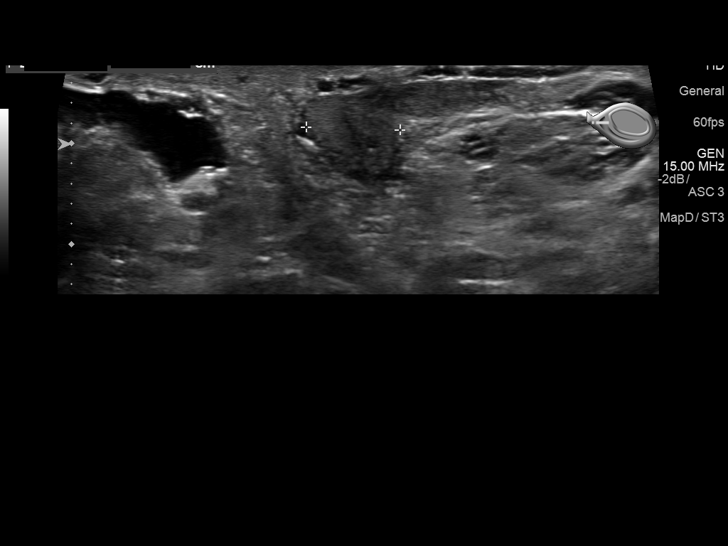
[im 63/63]
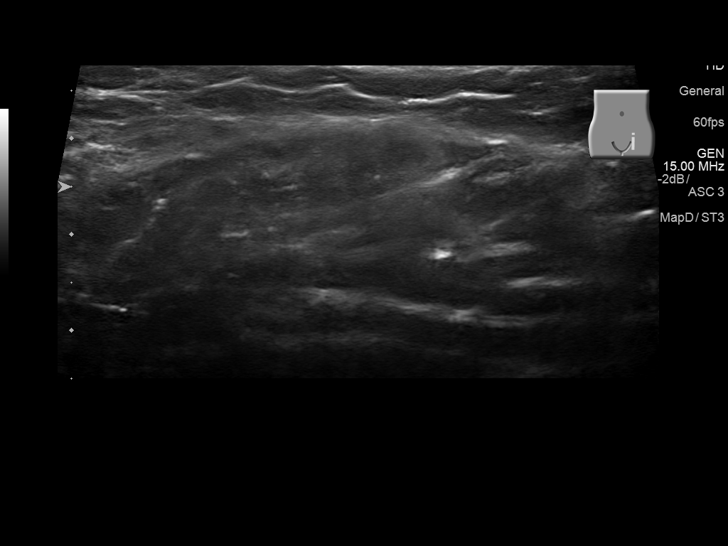

[13 of 25 positions shown; findings below may reference images not displayed]

FINDINGS: Right testicle

Measurements: 4.5 x 2.2 x 3.2 cm. No mass or microlithiasis
visualized.

Left testicle

Measurements: 4.4 x 2.1 x 2.8 cm. No mass or microlithiasis
visualized.

Right epididymis: Normal in size and appearance. No inflammatory
focus.

Left epididymis: Normal in size and appearance. No inflammatory
focus.

Hydrocele:  There are small hydroceles bilaterally.

Varicocele: There is a small left-sided varicocele with Valsalva
maneuver. No appreciable hydrocele on the right.

Pulsed Doppler interrogation of both testes demonstrates normal low
resistance arterial and venous waveforms bilaterally. The peak
systolic velocity in the left testis is 4 cm/sec. The peak systolic
velocity in the right testis is 5 cm/sec.

There is no scrotal abscess or scrotal wall thickening on either
side.
IMPRESSION: Small hydroceles bilaterally. Small left-sided varicocele. No
intratesticular or extratesticular mass appreciable on either side.
No inflammatory lesions apparent. No demonstrable testicular torsion
on either side.

## 2017-05-17 ENCOUNTER — Encounter: Payer: Self-pay | Admitting: Family Medicine

## 2017-05-17 ENCOUNTER — Other Ambulatory Visit: Payer: Self-pay

## 2017-05-17 ENCOUNTER — Ambulatory Visit: Payer: BLUE CROSS/BLUE SHIELD | Admitting: Family Medicine

## 2017-05-17 VITALS — BP 130/70 | HR 97 | Temp 98.4°F | Wt 229.5 lb

## 2017-05-17 DIAGNOSIS — J101 Influenza due to other identified influenza virus with other respiratory manifestations: Secondary | ICD-10-CM

## 2017-05-17 DIAGNOSIS — R509 Fever, unspecified: Secondary | ICD-10-CM | POA: Diagnosis not present

## 2017-05-17 LAB — POC INFLUENZA A&B (BINAX/QUICKVUE)
Influenza A, POC: POSITIVE — AB
Influenza B, POC: NEGATIVE

## 2017-05-17 NOTE — Assessment & Plan Note (Signed)
Discussed symptomatic care.  Hydration, rest. Call if SOB, cough worsening or prolongued fever. Reviewed flu timeline. He has no health problems that put her at risk for complications , so we decided against use of tamiflu. Pt agreed. Discussed family prophylaxis,He was advised to not return to work until 24 hour after fever resolved on no antipyretics.

## 2017-05-17 NOTE — Patient Instructions (Signed)
Rest, fluids. Mucinex Dm twice daily for cough.   Influenza, Adult Influenza, more commonly known as "the flu," is a viral infection that primarily affects the respiratory tract. The respiratory tract includes organs that help you breathe, such as the lungs, nose, and throat. The flu causes many common cold symptoms, as well as a high fever and body aches. The flu spreads easily from person to person (is contagious). Getting a flu shot (influenza vaccination) every year is the best way to prevent influenza. What are the causes? Influenza is caused by a virus. You can catch the virus by:  Breathing in droplets from an infected person's cough or sneeze.  Touching something that was recently contaminated with the virus and then touching your mouth, nose, or eyes.  What increases the risk? The following factors may make you more likely to get the flu:  Not cleaning your hands frequently with soap and water or alcohol-based hand sanitizer.  Having close contact with many people during cold and flu season.  Touching your mouth, eyes, or nose without washing or sanitizing your hands first.  Not drinking enough fluids or not eating a healthy diet.  Not getting enough sleep or exercise.  Being under a high amount of stress.  Not getting a yearly (annual) flu shot.  You may be at a higher risk of complications from the flu, such as a severe lung infection (pneumonia), if you:  Are over the age of 44.  Are pregnant.  Have a weakened disease-fighting system (immune system). You may have a weakened immune system if you: ? Have HIV or AIDS. ? Are undergoing chemotherapy. ? Aretaking medicines that reduce the activity of (suppress) the immune system.  Have a long-term (chronic) illness, such as heart disease, kidney disease, diabetes, or lung disease.  Have a liver disorder.  Are obese.  Have anemia.  What are the signs or symptoms? Symptoms of this condition typically last 4-10  days and may include:  Fever.  Chills.  Headache, body aches, or muscle aches.  Sore throat.  Cough.  Runny or congested nose.  Chest discomfort and cough.  Poor appetite.  Weakness or tiredness (fatigue).  Dizziness.  Nausea or vomiting.  How is this diagnosed? This condition may be diagnosed based on your medical history and a physical exam. Your health care provider may do a nose or throat swab test to confirm the diagnosis. How is this treated? If influenza is detected early, you can be treated with antiviral medicine that can reduce the length of your illness and the severity of your symptoms. This medicine may be given by mouth (orally) or through an IV tube that is inserted in one of your veins. The goal of treatment is to relieve symptoms by taking care of yourself at home. This may include taking over-the-counter medicines, drinking plenty of fluids, and adding humidity to the air in your home. In some cases, influenza goes away on its own. Severe influenza or complications from influenza may be treated in a hospital. Follow these instructions at home:  Take over-the-counter and prescription medicines only as told by your health care provider.  Use a cool mist humidifier to add humidity to the air in your home. This can make breathing easier.  Rest as needed.  Drink enough fluid to keep your urine clear or pale yellow.  Cover your mouth and nose when you cough or sneeze.  Wash your hands with soap and water often, especially after you cough or sneeze.  If soap and water are not available, use hand sanitizer.  Stay home from work or school as told by your health care provider. Unless you are visiting your health care provider, try to avoid leaving home until your fever has been gone for 24 hours without the use of medicine.  Keep all follow-up visits as told by your health care provider. This is important. How is this prevented?  Getting an annual flu shot is  the best way to avoid getting the flu. You may get the flu shot in late summer, fall, or winter. Ask your health care provider when you should get your flu shot.  Wash your hands often or use hand sanitizer often.  Avoid contact with people who are sick during cold and flu season.  Eat a healthy diet, drink plenty of fluids, get enough sleep, and exercise regularly. Contact a health care provider if:  You develop new symptoms.  You have: ? Chest pain. ? Diarrhea. ? A fever.  Your cough gets worse.  You produce more mucus.  You feel nauseous or you vomit. Get help right away if:  You develop shortness of breath or difficulty breathing.  Your skin or nails turn a bluish color.  You have severe pain or stiffness in your neck.  You develop a sudden headache or sudden pain in your face or ear.  You cannot stop vomiting. This information is not intended to replace advice given to you by your health care provider. Make sure you discuss any questions you have with your health care provider. Document Released: 03/04/2000 Document Revised: 08/13/2015 Document Reviewed: 12/30/2014 Elsevier Interactive Patient Education  2017 [employer]

## 2017-05-17 NOTE — Progress Notes (Signed)
   Subjective:    Patient ID: Jeffrey Cross, male    DOB: Oct 06, 1975, 42 y.o.   MRN: [id number]  Cough  This is a new problem. The current episode started in the past 7 days ( 1 week, worse in last 2 days). The problem has been gradually worsening. The cough is non-productive. Associated symptoms include ear congestion, a fever, headaches, myalgias, nasal congestion, rhinorrhea, a sore throat and shortness of breath. Pertinent negatives include no ear pain or wheezing. Associated symptoms comments:  Fatigue  fever 99-100 F. The symptoms are aggravated by lying down. Risk factors: nonsmoker. Treatments tried:  tylenol. The treatment provided mild relief. There is no history of asthma, COPD or environmental allergies.  Fever   Associated symptoms include coughing, headaches and a sore throat. Pertinent negatives include no ear pain or wheezing.   Blood pressure 130/70, pulse 97, temperature 98.4 F (36.9 C), temperature source Oral, weight 229 lb 8 oz (104.1 kg), SpO2 96 %.   Review of Systems  Constitutional: Positive for fever.  HENT: Positive for rhinorrhea and sore throat. Negative for ear pain.   Respiratory: Positive for cough and shortness of breath. Negative for wheezing.   Musculoskeletal: Positive for myalgias.  Allergic/Immunologic: Negative for environmental allergies.  Neurological: Positive for headaches.       Objective:   Physical Exam  Constitutional: Vital signs are normal. He appears well-developed and well-nourished.  Non-toxic appearance. He does not appear ill. No distress.  HENT:  Head: Normocephalic and atraumatic.  Right Ear: Hearing, tympanic membrane, external ear and ear canal normal. No tenderness. No foreign bodies. Tympanic membrane is not retracted and not bulging.  Left Ear: Hearing, tympanic membrane, external ear and ear canal normal. No tenderness. No foreign bodies. Tympanic membrane is not retracted and not bulging.  Nose: Mucosal edema and  rhinorrhea present. Right sinus exhibits no maxillary sinus tenderness and no frontal sinus tenderness. Left sinus exhibits no maxillary sinus tenderness and no frontal sinus tenderness.  Mouth/Throat: Uvula is midline and mucous membranes are normal. Normal dentition. No dental caries. Posterior oropharyngeal erythema present. No oropharyngeal exudate, posterior oropharyngeal edema or tonsillar abscesses.  Eyes: Conjunctivae, EOM and lids are normal. Pupils are equal, round, and reactive to light. Lids are everted and swept, no foreign bodies found.  Neck: Trachea normal, normal range of motion and phonation normal. Neck supple. Carotid bruit is not present. No thyroid mass and no thyromegaly present.  Cardiovascular: Normal rate, regular rhythm, S1 normal, S2 normal, normal heart sounds, intact distal pulses and normal pulses. Exam reveals no gallop.  No murmur heard. Pulmonary/Chest: Effort normal and breath sounds normal. No respiratory distress. He has no wheezes. He has no rhonchi. He has no rales.  Abdominal: Soft. Normal appearance and bowel sounds are normal. There is no hepatosplenomegaly. There is no tenderness. There is no rebound, no guarding and no CVA tenderness. No hernia.  Neurological: He is alert. He has normal reflexes.  Skin: Skin is warm, dry and intact. No rash noted.  Psychiatric: He has a normal mood and affect. His speech is normal and behavior is normal. Judgment normal.          Assessment & Plan:

## 2017-07-24 ENCOUNTER — Ambulatory Visit (INDEPENDENT_AMBULATORY_CARE_PROVIDER_SITE_OTHER): Payer: Self-pay | Admitting: Physician Assistant

## 2017-07-24 ENCOUNTER — Other Ambulatory Visit: Payer: Self-pay

## 2017-07-24 ENCOUNTER — Encounter: Payer: Self-pay | Admitting: Physician Assistant

## 2017-07-24 VITALS — BP 124/84 | HR 96 | Temp 98.0°F | Ht 73.5 in | Wt 221.0 lb

## 2017-07-24 DIAGNOSIS — Z0289 Encounter for other administrative examinations: Secondary | ICD-10-CM

## 2017-07-24 NOTE — Progress Notes (Signed)
Commercial Driver Medical Examination   Jeffrey Cross is a 42 y.o. male with a pertinent medical history of good health who presents today for a commercial driver fitness determination physical exam. The patient reports no problems today. In the past the patient reports receiving 2 year certificates. He denies focal neurological deficits, vision and hearing changes. He denies the habitual use of benzodiazepines, opioids, amphetamines and denies illicit drug use.   Current medications, family history, allergies, social history reviewed by me and exist elsewhere in the encounter.   Review of Systems  Constitutional: Negative for chills, diaphoresis and fever.  HENT: Negative for congestion, hearing loss and sore throat.   Eyes: Negative.   Respiratory: Negative for cough, hemoptysis, sputum production, shortness of breath and wheezing.   Cardiovascular: Negative for chest pain, orthopnea and leg swelling.  Gastrointestinal: Negative for abdominal pain, blood in stool, constipation, diarrhea, heartburn, melena, nausea and vomiting.  Genitourinary: Negative for dysuria, flank pain, frequency, hematuria and urgency.  Skin: Negative for rash.  Neurological: Negative for dizziness, sensory change, speech change, focal weakness and headaches.    Objective:     Vision/hearing:  Visual Acuity Screening   Right eye Left eye Both eyes  Without correction:     With correction: 20/15 20/15 2015  Comments: Peripheral Vision: Right eye 85 degrees. Left eye 85 degrees. The patient can distinguish the colors red, amber and green.  Hearing Screening Comments: The patient was able to hear a forced whisper from 10 feet.  Applicant can recognize and distinguish among traffic control signals and devices showing standard red, green, and amber colors.  Corrective lenses required: Yes  Monocular Vision?: No  Hearing aid requirement: No  Physical Exam  Constitutional: He is oriented to person,  place, and time. He appears well-developed and well-nourished. He does not appear ill.  HENT:  Head: Normocephalic and atraumatic.  Right Ear: Hearing, tympanic membrane, external ear and ear canal normal.  Left Ear: Hearing, tympanic membrane, external ear and ear canal normal.  Nose: Nose normal.  Mouth/Throat: Uvula is midline, oropharynx is clear and moist and mucous membranes are normal.  Eyes: Pupils are equal, round, and reactive to light. Conjunctivae, EOM and lids are normal. Right eye exhibits no discharge. Left eye exhibits no discharge. No scleral icterus.  Neck: Trachea normal and normal range of motion. Neck supple. Carotid bruit is not present.  Cardiovascular: Normal rate, regular rhythm, normal heart sounds, intact distal pulses and normal pulses.  No murmur heard. Pulmonary/Chest: Effort normal and breath sounds normal. No respiratory distress. He has no wheezes. He has no rhonchi. He has no rales.  Abdominal: Soft. Normal appearance and bowel sounds are normal. He exhibits no distension, no abdominal bruit and no mass. There is no tenderness. There is no rebound and no guarding. No hernia.  Musculoskeletal: Normal range of motion. He exhibits no edema or tenderness.  Lymphadenopathy:       Head (right side): No submental, no submandibular, no tonsillar, no preauricular, no posterior auricular and no occipital adenopathy present.       Head (left side): No submental, no submandibular, no tonsillar, no preauricular, no posterior auricular and no occipital adenopathy present.    He has no cervical adenopathy.  Neurological: He is alert and oriented to person, place, and time. He has normal strength and normal reflexes. No cranial nerve deficit or sensory deficit. Coordination and gait normal.  Skin: Skin is warm, dry and intact. No lesion and no rash  noted. He is not diaphoretic.  Psychiatric: He has a normal mood and affect. His speech is normal and behavior is normal. Judgment  and thought content normal.  Nursing note and vitals reviewed.   BP 124/84 (BP Location: Left Arm, Patient Position: Sitting, Cuff Size: Normal)   Pulse 96   Temp 98 F (36.7 C) (Oral)   Ht 6' 1.5" (1.867 m)   Wt 221 lb (100.2 kg)   SpO2 99%   BMI 28.76 kg/m   Labs:  Urine - for blood, glucose, protein, SP 1.020  Assessment:    Healthy male exam.  Meets standards in 72 CFR 391.41;  qualifies for 2 year certificate.    Plan:    Medical examiners certificate completed and printed. Return as needed.    Deliah Boston, MS, PA-C 1:52 PM, 07/24/2017

## 2017-07-24 NOTE — Patient Instructions (Signed)
     IF you received an x-ray today, you will receive an invoice from [facility]. Please contact  Radiology at [phone] with questions or concerns regarding your invoice.   IF you received labwork today, you will receive an invoice from [facility]. Please contact [facility] at [phone] with questions or concerns regarding your invoice.   Our billing staff will not be able to assist you with questions regarding bills from these companies.  You will be contacted with the lab results as soon as they are available. The fastest way to get your results is to activate your My Chart account. Instructions are located on the last page of this paperwork. If you have not heard from us regarding the results in 2 weeks, please contact this office.     

## 2017-07-31 ENCOUNTER — Other Ambulatory Visit: Payer: Self-pay | Admitting: Internal Medicine

## 2017-07-31 NOTE — Telephone Encounter (Signed)
Approved: okay #[phone] x 1

## 2017-07-31 NOTE — Telephone Encounter (Signed)
Medication is no longer on his med list. He has not been seen for routine OV since 2015. Several acute OVs with other providers.

## 2018-06-08 ENCOUNTER — Other Ambulatory Visit: Payer: Self-pay | Admitting: Internal Medicine

## 2018-12-20 ENCOUNTER — Other Ambulatory Visit: Payer: Self-pay

## 2018-12-20 DIAGNOSIS — K624 Stenosis of anus and rectum: Secondary | ICD-10-CM

## 2018-12-21 LAB — NOVEL CORONAVIRUS, NAA: SARS-CoV-2, NAA: NOT DETECTED

## 2019-01-30 MED ORDER — SILDENAFIL CITRATE 20 MG PO TABS: ORAL_TABLET | ORAL | 3 refills | Status: DC

## 2019-01-30 NOTE — Telephone Encounter (Signed)
Last CPE/OV 07/2017 Will have to forward to Dr Silvio Pate to decide.

## 2019-01-30 NOTE — Telephone Encounter (Signed)
I sent in the prescription Please set him up for a PE in the next 6-12 months

## 2019-01-30 NOTE — Telephone Encounter (Signed)
I left a detailed message on patient's voice mail to call back and schedule cpx  In 6-12 months.

## 2019-03-19 ENCOUNTER — Other Ambulatory Visit: Payer: Self-pay

## 2019-03-19 ENCOUNTER — Ambulatory Visit (INDEPENDENT_AMBULATORY_CARE_PROVIDER_SITE_OTHER): Payer: BC Managed Care – PPO | Admitting: Internal Medicine

## 2019-03-19 ENCOUNTER — Encounter: Payer: Self-pay | Admitting: Internal Medicine

## 2019-03-19 VITALS — BP 122/72 | HR 77 | Temp 98.0°F | Ht 73.5 in | Wt 232.0 lb

## 2019-03-19 DIAGNOSIS — Z0000 Encounter for general adult medical examination without abnormal findings: Secondary | ICD-10-CM | POA: Diagnosis not present

## 2019-03-19 DIAGNOSIS — Z23 Encounter for immunization: Secondary | ICD-10-CM

## 2019-03-19 DIAGNOSIS — N528 Other male erectile dysfunction: Secondary | ICD-10-CM

## 2019-03-19 MED ORDER — SILDENAFIL CITRATE 20 MG PO TABS: ORAL_TABLET | ORAL | 11 refills | Status: DC

## 2019-03-19 NOTE — Patient Instructions (Signed)

## 2019-03-19 NOTE — Assessment & Plan Note (Signed)
Healthy but has let himself go No cancer screening yet Will update Td and give flu vaccine COVID vaccine when available

## 2019-03-19 NOTE — Progress Notes (Signed)
Subjective:    Patient ID: Jeffrey Cross, male    DOB: Jun 30, 1975, 43 y.o.   MRN: [id number]  HPI Here for physical  This visit occurred during the SARS-CoV-2 public health emergency.  Safety protocols were in place, including screening questions prior to the visit, additional usage of staff PPE, and extensive cleaning of exam room while observing appropriate contact time as indicated for disinfecting solutions.   Doing okay with COVID Has been able to go to beach, etc  Has spot on right forehead Seems bigger--wife is concerned  Uses the sildenafil---works well  Current Outpatient Medications on File Prior to Visit  Medication Sig Dispense Refill  . sildenafil (REVATIO) 20 MG tablet TAKE 3 TO 5 TABLETS BY MOUTH DAILY AS NEEDED 50 tablet 3   No current facility-administered medications on file prior to visit.    Allergies  Allergen Reactions  . Augmentin [Amoxicillin-Pot Clavulanate] Hives    rash    Past Medical History:  Diagnosis Date  . Prostatitis 09/2011    Past Surgical History:  Procedure Laterality Date  . NASAL SINUS SURGERY  3/09    Family History  Problem Relation Age of Onset  . Healthy Mother   . Healthy Father   . Diabetes Other        Aunt  . Prostate cancer Paternal Grandfather     Social History   Socioeconomic History  . Marital status: Married    Spouse name: Not on file  . Number of children: 2  . Years of education: Not on file  . Highest education level: Not on file  Occupational History  . 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  Tobacco Use  . Smoking status: Former Smoker    Packs/day: 1.50    Types: Cigarettes    Quit date: 06/28/2011    Years since quitting: 7.7  . Smokeless tobacco: Never Used  . Tobacco comment: using electronic cigarettes  Substance and Sexual Activity  . Alcohol use: Yes    Comment: Rare  . Drug use: No  . Sexual activity: Not on file  Other Topics Concern  . Not on file   Social History Narrative   Married; 2 children--1 with wife and one with shared custody with the mother      Plumber--needs CDL for overweight vehicle            Social Determinants of Health   Financial Resource Strain:   . Difficulty of Paying Living Expenses: Not on file  Food Insecurity:   . Worried About Charity fundraiser in the Last Year: Not on file  . Ran Out of Food in the Last Year: Not on file  Transportation Needs:   . Lack of Transportation (Medical): Not on file  . Lack of Transportation (Non-Medical): Not on file  Physical Activity:   . Days of Exercise per Week: Not on file  . Minutes of Exercise per Session: Not on file  Stress:   . Feeling of Stress : Not on file  Social Connections:   . Frequency of Communication with Friends and Family: Not on file  . Frequency of Social Gatherings with Friends and Family: Not on file  . Attends Religious Services: Not on file  . Active Member of Clubs or Organizations: Not on file  . Attends Archivist Meetings: Not on file  . Marital Status: Not on file  Intimate Partner Violence:   . Fear of  Current or Ex-Partner: Not on file  . Emotionally Abused: Not on file  . Physically Abused: Not on file  . Sexually Abused: Not on file   Review of Systems  Constitutional: Negative for fatigue.       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        Keeps up with dentist  Eyes: Negative for visual disturbance.       No diplopia or unilateral vision loss  Respiratory: Negative for cough, chest tightness and shortness of breath.   Cardiovascular: Negative for chest pain, palpitations and leg swelling.  Gastrointestinal: Negative for abdominal pain, blood in stool and constipation.       Occasional heartburn after eating--no meds  Endocrine: Negative for polydipsia and  polyuria.  Genitourinary: Negative for difficulty urinating, frequency and urgency.  Musculoskeletal: Negative for arthralgias, back pain and joint swelling.  Skin: Negative for rash.       Several lumps under skin---left forearm and abdominal wall  Allergic/Immunologic: Negative for environmental allergies and immunocompromised state.  Neurological: Negative for dizziness, syncope, light-headedness and headaches.  Psychiatric/Behavioral: Negative for dysphoric mood and sleep disturbance. The patient is not nervous/anxious.        Objective:   Physical Exam  Constitutional: He is oriented to person, place, and time. He appears well-developed. No distress.  HENT:  Head: Normocephalic and atraumatic.  Right Ear: External ear normal.  Left Ear: External ear normal.  Mouth/Throat: Oropharynx is clear and moist. No oropharyngeal exudate.  Eyes: Pupils are equal, round, and reactive to light. Conjunctivae are normal.  Neck: No thyromegaly present.  Cardiovascular: Normal rate, regular rhythm, normal heart sounds and intact distal pulses. Exam reveals no gallop.  No murmur heard. Respiratory: Effort normal and breath sounds normal. No respiratory distress. He has no wheezes. He has no rales.  GI: Soft. There is no abdominal tenderness.  Musculoskeletal:        General: No tenderness or edema.  Lymphadenopathy:    He has no cervical adenopathy.  Neurological: He is alert and oriented to person, place, and time.  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 His behavior is normal.           Assessment & Plan:

## 2019-03-19 NOTE — Addendum Note (Signed)
Addended by: Pilar Grammes on: 03/19/2019 12:34 PM   Modules accepted: Orders

## 2019-03-19 NOTE — Assessment & Plan Note (Signed)
Will refill the sildenafil

## 2019-07-20 ENCOUNTER — Emergency Department
Admission: EM | Admit: 2019-07-20 | Discharge: 2019-07-20 | Disposition: A | Discharge status: Home / Self Care | Payer: BC Managed Care – PPO | Attending: Emergency Medicine | Admitting: Emergency Medicine

## 2019-07-20 ENCOUNTER — Other Ambulatory Visit: Payer: Self-pay

## 2019-07-20 ENCOUNTER — Emergency Department: Payer: BC Managed Care – PPO

## 2019-07-20 DIAGNOSIS — Y9389 Activity, other specified: Secondary | ICD-10-CM | POA: Insufficient documentation

## 2019-07-20 DIAGNOSIS — Y998 Other external cause status: Secondary | ICD-10-CM | POA: Insufficient documentation

## 2019-07-20 DIAGNOSIS — Z87891 Personal history of nicotine dependence: Secondary | ICD-10-CM | POA: Insufficient documentation

## 2019-07-20 DIAGNOSIS — S62630A Displaced fracture of distal phalanx of right index finger, initial encounter for closed fracture: Secondary | ICD-10-CM | POA: Insufficient documentation

## 2019-07-20 DIAGNOSIS — W231XXA Caught, crushed, jammed, or pinched between stationary objects, initial encounter: Secondary | ICD-10-CM | POA: Insufficient documentation

## 2019-07-20 DIAGNOSIS — Y9289 Other specified places as the place of occurrence of the external cause: Secondary | ICD-10-CM | POA: Diagnosis not present

## 2019-07-20 DIAGNOSIS — S6991XA Unspecified injury of right wrist, hand and finger(s), initial encounter: Secondary | ICD-10-CM | POA: Diagnosis not present

## 2019-07-20 DIAGNOSIS — S61310A Laceration without foreign body of right index finger with damage to nail, initial encounter: Secondary | ICD-10-CM | POA: Diagnosis not present

## 2019-07-20 MED ORDER — ACETAMINOPHEN-CODEINE #3 300-30 MG PO TABS: 1.0000 | ORAL_TABLET | Freq: Two times a day (BID) | ORAL | 0 refills | Status: DC | PRN

## 2019-07-20 MED ORDER — SULFAMETHOXAZOLE-TRIMETHOPRIM 800-160 MG PO TABS
1.0000 | ORAL_TABLET | Freq: Once | ORAL | Status: AC
  Administered 2019-07-20: 1 via ORAL
  Filled 2019-07-20: qty 1

## 2019-07-20 MED ORDER — SULFAMETHOXAZOLE-TRIMETHOPRIM 400-80 MG PO TABS: 1.0000 | ORAL_TABLET | Freq: Two times a day (BID) | ORAL | 0 refills | Status: DC

## 2019-07-20 MED ORDER — ACETAMINOPHEN 500 MG PO TABS
1000.0000 mg | ORAL_TABLET | Freq: Once | ORAL | Status: AC
  Administered 2019-07-20: 1000 mg via ORAL
  Filled 2019-07-20: qty 2

## 2019-07-20 MED ORDER — LIDOCAINE HCL (PF) 1 % IJ SOLN
5.0000 mL | Freq: Once | INTRAMUSCULAR | Status: AC
  Administered 2019-07-20: 5 mL via INTRADERMAL
  Filled 2019-07-20: qty 5

## 2019-07-20 MED ORDER — BACITRACIN-NEOMYCIN-POLYMYXIN 400-5-5000 EX OINT
TOPICAL_OINTMENT | Freq: Once | CUTANEOUS | Status: AC
  Administered 2019-07-20: 1 via TOPICAL
  Filled 2019-07-20: qty 1

## 2019-07-20 NOTE — ED Notes (Signed)
Ointment and sterile gauze applied to right index finger. Static finger splint and gauze wrap applied to right index finger. Pt reports decreased sensation due to lidocaine. Capillary refill <3 seconds, movement decreased du to injury.

## 2019-07-20 NOTE — ED Provider Notes (Signed)
[facility] Emergency Department Provider Note ____________________________________________  Time seen: 1205  I have reviewed the triage vital signs and the nursing notes.  HISTORY  Chief Complaint  Laceration   HPI YOUSSEF Cross is a 44 y.o. male presents to the ER today with c/o injury to his right index finger. He reports he was working on his aerator when his finger got caught and pulled forward. He reports he was able to see all the way into his finger, underneath his nailbed and sustained 2 laceration on either side of his index finger. He washed the area was able to control the bleeding. His last tetanus was 02/2019.  Past Medical History:  Diagnosis Date  . Prostatitis 09/2011    Patient Active Problem List   Diagnosis Date Noted  . Erectile dysfunction 12/17/2013  . Routine general medical examination at a health care facility 05/26/2011    Past Surgical History:  Procedure Laterality Date  . NASAL SINUS SURGERY  3/09    Prior to Admission medications   Medication Sig Start Date End Date Taking? Authorizing Provider  acetaminophen-codeine (TYLENOL #3) 300-30 MG tablet Take 1 tablet by mouth every 12 (twelve) hours as needed for moderate pain. 07/20/19   Lorre Munroe, NP  sildenafil (REVATIO) 20 MG tablet TAKE 3 TO 5 TABLETS BY MOUTH DAILY AS NEEDED 03/19/19   Karie Schwalbe, MD  sulfamethoxazole-trimethoprim (BACTRIM) 400-80 MG tablet Take 1 tablet by mouth 2 (two) times daily. 07/20/19   Lorre Munroe, NP    Allergies Amoxicillin-pot clavulanate  Family History  Problem Relation Age of Onset  . Healthy Mother   . Healthy Father   . Diabetes Other        Aunt  . Prostate cancer Paternal Grandfather     Social History Social History   Tobacco Use  . Smoking status: Former Smoker    Packs/day: 1.50    Types: Cigarettes    Quit date: 06/28/2011    Years since quitting: 8.0  . Smokeless tobacco: Never Used  . Tobacco comment:  using electronic cigarettes  Substance Use Topics  . Alcohol use: Yes    Comment: Rare  . Drug use: No    Review of Systems  Constitutional: Negative for fever, chills or body aches. Cardiovascular: Negative for chest pain or chest tightness. Respiratory: Negative for cough or shortness of breath. Musculoskeletal: Positive for right index finger pain. Skin: Positive for laceration to right index finger. Neurological: Negative for numbness, tingling or weakness of right upper extremity. ____________________________________________  PHYSICAL EXAM:  VITAL SIGNS: ED Triage Vitals  Enc Vitals Group     BP 07/20/19 1154 137/85     Pulse Rate 07/20/19 1154 (!) 103     Resp 07/20/19 1154 16     Temp 07/20/19 1154 98.3 F (36.8 C)     Temp Source 07/20/19 1154 Oral     SpO2 07/20/19 1154 98 %     Weight 07/20/19 1156 230 lb (104.3 kg)     Height 07/20/19 1156 6\' 2"  (1.88 m)     Head Circumference --      Peak Flow --      Pain Score 07/20/19 1155 4     Pain Loc --      Pain Edu? --      Excl. in GC? --     Constitutional: Alert and oriented. Well appearing and in no distress. Cardiovascular: Tachcyardic, regular rhythm. Normal distal pulses. Respiratory: Normal respiratory  effort. No wheezes/rales/rhonchi. Musculoskeletal: Normal flexion of the right index finger. Decreased active extension but full passive extension of left index finger.  Neurologic:  Sensation intact to tip of right index finger. Skin:  0.5 cm laceration on medial and lateral side of right index finger. ____________________________________________  RADIOLOGY   Imaging Orders     DG Finger Index Right  _ IMPRESSION:  Displaced transverse fracture at base of distal phalanx RIGHT index  finger.   ___________________________________________  PROCEDURES  .Marland KitchenLaceration Repair  Date/Time: 07/20/2019 1:51 PM Performed by: Jearld Fenton, NP Authorized by: Jearld Fenton, NP   Consent:    Consent  obtained:  Verbal   Consent given by:  Patient   Risks discussed:  Infection, pain and poor cosmetic result Anesthesia (see MAR for exact dosages):    Anesthesia method:  Local infiltration   Local anesthetic:  Lidocaine 1% w/o epi Laceration details:    Location:  Finger   Finger location:  R index finger   Length (cm):  0.5 Repair type:    Repair type:  Intermediate Pre-procedure details:    Preparation:  Patient was prepped and draped in usual sterile fashion Exploration:    Hemostasis achieved with:  Direct pressure   Wound exploration: wound explored through full range of motion and entire depth of wound probed and visualized     Contaminated: no   Treatment:    Area cleansed with:  Betadine and saline   Amount of cleaning:  Standard   Irrigation solution:  Sterile saline   Irrigation method:  Syringe   Visualized foreign bodies/material removed: no   Skin repair:    Repair method:  Sutures   Suture size:  5-0   Suture material:  Nylon   Suture technique:  Simple interrupted   Number of sutures:  8 Approximation:    Approximation:  Close Post-procedure details:    Dressing:  Antibiotic ointment, non-adherent dressing and splint for protection   Patient tolerance of procedure:  Tolerated well, no immediate complications    ____________________________________________  INITIAL IMPRESSION / ASSESSMENT AND PLAN / ED COURSE  Laceration of Right Index Finger:  Xray right index finger Tylenol 1000 mg PO x 1 Sutured- see procedure note Splint applied Septra DS 1 tab PO x 1 RX for Septra 1 tab PO BID x 7 days RX for Tylenol #3 BID prn x 5 days Will have him follow up with ortho (poggi) as an outpatient Follow up with PCP in 7-10 days for suture removal ____________________________________________  FINAL CLINICAL IMPRESSION(S) / ED DIAGNOSES  Final diagnoses:  Laceration of right index finger without foreign body with damage to nail, initial encounter  Displaced  fracture of distal phalanx of right index finger, initial encounter for closed fracture      Jearld Fenton, NP 07/20/19 1432    Duffy Bruce, MD 07/20/19 2039

## 2019-07-20 NOTE — ED Notes (Signed)
Pt with laceration to right index finger. Bleeding controlled at this time.

## 2019-07-20 NOTE — ED Triage Notes (Signed)
Pt reports that he was working on his aerator and the pin from the aerator penetrated his finger, pt states he could see into his finger .

## 2019-07-20 NOTE — Discharge Instructions (Addendum)
You were seen today for laceration and fracture of right index finger.  You received 8 sutures and were placed in a splint.  I have given you prescriptions for antibiotics to take twice daily for the next 7 days.  I have prescribed you Tylenol to take every 12 hours as needed.  This may cause some sedation.  Please follow-up with Dr. Karle Starch office in 7 to 10 days for suture removal.  Please call and schedule appointment with Dr. Victorio Palm, orthopedics for follow-up of finger fracture.

## 2019-07-20 NOTE — ED Triage Notes (Addendum)
Lac noted to right index finger that got caught in a hook, pt had last tetanus shot in Dec 2020.

## 2019-07-25 DIAGNOSIS — S62630A Displaced fracture of distal phalanx of right index finger, initial encounter for closed fracture: Secondary | ICD-10-CM | POA: Diagnosis not present

## 2019-07-25 DIAGNOSIS — S61210A Laceration without foreign body of right index finger without damage to nail, initial encounter: Secondary | ICD-10-CM | POA: Diagnosis not present

## 2019-08-09 DIAGNOSIS — S62630D Displaced fracture of distal phalanx of right index finger, subsequent encounter for fracture with routine healing: Secondary | ICD-10-CM | POA: Diagnosis not present

## 2019-08-09 DIAGNOSIS — S61210D Laceration without foreign body of right index finger without damage to nail, subsequent encounter: Secondary | ICD-10-CM | POA: Diagnosis not present

## 2019-08-27 ENCOUNTER — Ambulatory Visit: Payer: Self-pay | Admitting: Emergency Medicine

## 2019-09-06 DIAGNOSIS — S62630D Displaced fracture of distal phalanx of right index finger, subsequent encounter for fracture with routine healing: Secondary | ICD-10-CM | POA: Diagnosis not present

## 2019-10-09 ENCOUNTER — Encounter: Payer: Self-pay | Admitting: Emergency Medicine

## 2019-10-09 ENCOUNTER — Other Ambulatory Visit: Payer: Self-pay

## 2019-10-09 ENCOUNTER — Ambulatory Visit: Payer: Self-pay | Admitting: Emergency Medicine

## 2019-10-09 VITALS — BP 126/83 | HR 99 | Temp 98.2°F | Resp 16 | Ht 73.0 in | Wt 229.0 lb

## 2019-10-09 DIAGNOSIS — Z024 Encounter for examination for driving license: Secondary | ICD-10-CM

## 2019-10-09 NOTE — Patient Instructions (Addendum)
   If you have lab work done today you will be contacted with your lab results within the next 2 weeks.  If you have not heard from us then please contact us. The fastest way to get your results is to register for My Chart.   IF you received an x-ray today, you will receive an invoice from [facility]. Please contact [facility] at [phone] with questions or concerns regarding your invoice.   IF you received labwork today, you will receive an invoice from [facility]. Please contact [facility] at [phone] with questions or concerns regarding your invoice.   Our billing staff will not be able to assist you with questions regarding bills from these companies.  You will be contacted with the lab results as soon as they are available. The fastest way to get your results is to activate your My Chart account. Instructions are located on the last page of this paperwork. If you have not heard from us regarding the results in 2 weeks, please contact this office.      Health Maintenance, Male Adopting a healthy lifestyle and getting preventive care are important in promoting health and wellness. Ask your health care provider about:  The right schedule for you to have regular tests and exams.  Things you can do on your own to prevent diseases and keep yourself healthy. What should I know about diet, weight, and exercise? Eat a healthy diet   Eat a diet that includes plenty of vegetables, fruits, low-fat dairy products, and lean protein.  Do not eat a lot of foods that are high in solid fats, added sugars, or sodium. Maintain a healthy weight Body mass index (BMI) is a measurement that can be used to identify possible weight problems. It estimates body fat based on height and weight. Your health care provider can help determine your BMI and help you achieve or maintain a healthy weight. Get regular exercise Get regular exercise. This is one of the most important things you  can do for your health. Most adults should:  Exercise for at least 150 minutes each week. The exercise should increase your heart rate and make you sweat (moderate-intensity exercise).  Do strengthening exercises at least twice a week. This is in addition to the moderate-intensity exercise.  Spend less time sitting. Even light physical activity can be beneficial. Watch cholesterol and blood lipids Have your blood tested for lipids and cholesterol at 45 years of age, then have this test every 5 years. You may need to have your cholesterol levels checked more often if:  Your lipid or cholesterol levels are high.  You are older than 45 years of age.  You are at high risk for heart disease. What should I know about cancer screening? Many types of cancers can be detected early and may often be prevented. Depending on your health history and family history, you may need to have cancer screening at various ages. This may include screening for:  Colorectal cancer.  Prostate cancer.  Skin cancer.  Lung cancer. What should I know about heart disease, diabetes, and high blood pressure? Blood pressure and heart disease  High blood pressure causes heart disease and increases the risk of stroke. This is more likely to develop in people who have high blood pressure readings, are of African descent, or are overweight.  Talk with your health care provider about your target blood pressure readings.  Have your blood pressure checked: ? Every 3-5 years if you are 18-39   years of age. ? Every year if you are 40 years old or older.  If you are between the ages of 65 and 75 and are a current or former smoker, ask your health care provider if you should have a one-time screening for abdominal aortic aneurysm (AAA). Diabetes Have regular diabetes screenings. This checks your fasting blood sugar level. Have the screening done:  Once every three years after age 45 if you are at a normal weight and have  a low risk for diabetes.  More often and at a younger age if you are overweight or have a high risk for diabetes. What should I know about preventing infection? Hepatitis B If you have a higher risk for hepatitis B, you should be screened for this virus. Talk with your health care provider to find out if you are at risk for hepatitis B infection. Hepatitis C Blood testing is recommended for:  Everyone born from 1945 through 1965.  Anyone with known risk factors for hepatitis C. Sexually transmitted infections (STIs)  You should be screened each year for STIs, including gonorrhea and chlamydia, if: ? You are sexually active and are younger than 45 years of age. ? You are older than 45 years of age and your health care provider tells you that you are at risk for this type of infection. ? Your sexual activity has changed since you were last screened, and you are at increased risk for chlamydia or gonorrhea. Ask your health care provider if you are at risk.  Ask your health care provider about whether you are at high risk for HIV. Your health care provider may recommend a prescription medicine to help prevent HIV infection. If you choose to take medicine to prevent HIV, you should first get tested for HIV. You should then be tested every 3 months for as long as you are taking the medicine. Follow these instructions at home: Lifestyle  Do not use any products that contain nicotine or tobacco, such as cigarettes, e-cigarettes, and chewing tobacco. If you need help quitting, ask your health care provider.  Do not use street drugs.  Do not share needles.  Ask your health care provider for help if you need support or information about quitting drugs. Alcohol use  Do not drink alcohol if your health care provider tells you not to drink.  If you drink alcohol: ? Limit how much you have to 0-2 drinks a day. ? Be aware of how much alcohol is in your drink. In the [place] one drink equals one 12  oz bottle of beer (355 mL), one 5 oz glass of wine (148 mL), or one 1 oz glass of hard liquor (44 mL). General instructions  Schedule regular health, dental, and eye exams.  Stay current with your vaccines.  Tell your health care provider if: ? You often feel depressed. ? You have ever been abused or do not feel safe at home. Summary  Adopting a healthy lifestyle and getting preventive care are important in promoting health and wellness.  Follow your health care provider's instructions about healthy diet, exercising, and getting tested or screened for diseases.  Follow your health care provider's instructions on monitoring your cholesterol and blood pressure. This information is not intended to replace advice given to you by your health care provider. Make sure you discuss any questions you have with your health care provider. Document Revised: 02/28/2018 Document Reviewed: 02/28/2018 Elsevier Patient Education  2020 Elsevier Inc.  

## 2019-10-09 NOTE — Progress Notes (Signed)
This patient presents for DOT examination for fitness for duty.   Medical History:  1. Head/Brain Injuries, disorders or illnesses no  2. Seizures, epilepsy no  3. Eye disorders or impaired vision (except corrective lenses) no  4. Ear disorders, loss of hearing or balance no  5. Heart disease or heart attack, other cardiovascular condition no  6. Heart surgery (valve replacement/bypass, angioplasty, pacemaker/defribrillator) no  7. High blood pressure no  8. High cholesterol no  9. Chronic cough, shortness of breath or other breathing problems no  10. Lung disease (emphysema, asthma or chronic bronchitis) no  11. Kidney disease, dialysis no  12. Digestive problems  no  13. Diabetes or elevated blood sugar no                      If yes to #13, Insulin use n/a  14. Nervious or psychiatric disorders, e.g., severe depression no  15. Fainting or syncope no  16. Dizziness, headaches, numbness, tingling or memory loss no  17. Unexplained weight loss no  18. Stroke, TIA or paralysis no  19. Missing or impaired hand, arm, foot, leg, finger, toe no  20. Spinal injury or disease no  21. Bone, muscles or nerve problems no  22. Blood clots or bleeding bleeding disorders no  23. Cancer no  24. Chronic infection or other chronic diseases no  25. Sleep disorders, pauses in breathing while asleep, daytime sleepiness, loud snoring no  26. Have you ever had a sleep test? no  27.  Have you ever spent a night in the hospital? no  28. Have you ever had a broken bone? yes  29. Have you or or do you use tobacco products? no  30. Regular, frequent alcohol use no  31. Illegal substance use within the past 2 years no  32.  Have you ever failed a drug test or been dependent on an illegal substance? no   Current Medications: Prior to Admission medications   Medication Sig Start Date End Date Taking? Authorizing Provider  sildenafil (REVATIO) 20 MG tablet TAKE 3 TO 5 TABLETS BY MOUTH DAILY AS  NEEDED 03/19/19  Yes Karie Schwalbe, MD  acetaminophen-codeine (TYLENOL #3) 300-30 MG tablet Take 1 tablet by mouth every 12 (twelve) hours as needed for moderate pain. Patient not taking: Reported on 10/09/2019 07/20/19   Lorre Munroe, NP  sulfamethoxazole-trimethoprim (BACTRIM) 400-80 MG tablet Take 1 tablet by mouth 2 (two) times daily. Patient not taking: Reported on 10/09/2019 07/20/19   Lorre Munroe, NP    Medical Examiner's Comments on Health History:  In good general medical condition.  No chronic medical problems.  No chronic medications.  TESTING:  No exam data present  Monocular Vision: No.  Hearing Aid used for test: No. Hearing Aid required to to meet standard: No.  BP 126/83   Pulse 99   Temp 98.2 F (36.8 C) (Temporal)   Resp 16   Ht 6\' 1"  (1.854 m)   Wt 229 lb (103.9 kg)   SpO2 96%   BMI 30.21 kg/m  Pulse rate is regular     PHYSICAL EXAMINATION:  General Appearance Not markedly obese. No tremor, signs of alcoholism, problem drinking or drug abuse.   Skin Warm, dry and intact.   Eyes Pupils are equal, round and reactive to light and accommodation, extraocular movements are intact. No exophthalmos, no nystagmus.   Ears Normal external ears. External canal without occlusion. No scarring of  the TM. No perforation of the TM.  Mouth and Throat Clear and moist. No irremedial deformities likely to interfere with breathing or swallowing.  Heart No murmurs, extra sounds, evidence of cardiomegaly. No pacemaker. No implantable defibrillator.  Lungs and Chest (excluding breasts) Normal chest expansion, respiratory rate, breath sounds. No cyanosis.  Abdomen and Viscera No liver enlargement. No splenic enlargement. No masses, bruits, hernias or significant abdominal wall weakness.  Genitourinary  No inguinal or femoral hernia.  Spine and other musculoskeletal No tenderness, no limitation of motion, no deformities. No evidence of previous surgery.  Extremities No loss  or impairment of leg, foot, toe, arm, hand, finger. No perceptible limp, deformities, atrophy, weakness, paralysis, clubbing, edema, hypotonia. Patient has sufficient grasp and prehension to maintain steering wheel grip. Patient has sufficient mobility and strength in the lower limbs to operate pedals properly.  Neurologic Normal equilibrium, coordination, speech pattern. No paresthesia, asymmetry of deep tendon reflexes, sensory or positional abnormalities. No abnormality of patellar or Babinski's reflexes.  Gait Not antalgic or ataxic  Vascular Normal pulses. No carotid or arterial bruits. No varicose veins.     Certification Status: does meet standards for 2 year certificate.  Certification expires 10/08/2021

## 2020-03-17 MED ORDER — SILDENAFIL CITRATE 20 MG PO TABS: ORAL_TABLET | ORAL | 1 refills | Status: DC

## 2020-03-18 MED ORDER — SILDENAFIL CITRATE 20 MG PO TABS: ORAL_TABLET | ORAL | 1 refills | Status: DC

## 2020-03-18 NOTE — Addendum Note (Signed)
Addended by: Eual Fines on: 03/18/2020 07:43 AM   Modules accepted: Orders

## 2020-05-08 ENCOUNTER — Encounter: Payer: Self-pay | Admitting: Internal Medicine

## 2020-05-08 ENCOUNTER — Ambulatory Visit: Payer: BC Managed Care – PPO | Admitting: Internal Medicine

## 2020-05-08 ENCOUNTER — Other Ambulatory Visit: Payer: Self-pay

## 2020-05-08 DIAGNOSIS — R1031 Right lower quadrant pain: Secondary | ICD-10-CM | POA: Diagnosis not present

## 2020-05-08 NOTE — Assessment & Plan Note (Signed)
No hernia Brief paroxysms of pain that suggest something radicular (L2-3?) Has arm symptoms also---but doubt MS If persistent problems---would proceed with MRI &/or neuro evaluation

## 2020-05-08 NOTE — Progress Notes (Signed)
Subjective:    Patient ID: Jeffrey Cross, male    DOB: 23-Feb-1976, 45 y.o.   MRN: [id number]  HPI Here due to right hip pain This visit occurred during the SARS-CoV-2 public health emergency.  Safety protocols were in place, including screening questions prior to the visit, additional usage of staff PPE, and extensive cleaning of exam room while observing appropriate contact time as indicated for disinfecting solutions.   Points to mid right inguinal area as painful Noticed while on vacation and after walking at [facility] in December Shoots down leg---5 or 6 hard stabbing pains  Then improved---but recurred several weeks later And again 2 weeks ago Prompted appt Never lasts more a brief time (1 minute)  No back pain No apparent inciting factors  Current Outpatient Medications on File Prior to Visit  Medication Sig Dispense Refill  . sildenafil (REVATIO) 20 MG tablet TAKE 3 TO 5 TABLETS BY MOUTH DAILY AS NEEDED 50 tablet 1   No current facility-administered medications on file prior to visit.    Allergies  Allergen Reactions  . Amoxicillin-Pot Clavulanate Hives and Rash    rash rash    Past Medical History:  Diagnosis Date  . Prostatitis 09/2011    Past Surgical History:  Procedure Laterality Date  . NASAL SINUS SURGERY  3/09    Family History  Problem Relation Age of Onset  . Healthy Mother   . Healthy Father   . Diabetes Other        Aunt  . Prostate cancer Paternal Grandfather     Social History   Socioeconomic History  . Marital status: Married    Spouse name: Not on file  . Number of children: 2  . Years of education: Not on file  . Highest education level: Not on file  Occupational History  . 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  Tobacco Use  . Smoking status: Former Smoker    Packs/day: 1.50    Types: Cigarettes    Quit date: 06/28/2011    Years since quitting: 8.8  . Smokeless tobacco: Never Used  . Tobacco  comment: using electronic cigarettes  Substance and Sexual Activity  . Alcohol use: Yes    Comment: Rare  . Drug use: No  . Sexual activity: Not on file  Other Topics Concern  . Not on file  Social History Narrative   Married; 2 children--1 with wife and one with shared custody with the mother      Plumber--needs CDL for overweight vehicle            Social Determinants of Health   Financial Resource Strain: Not on file  Food Insecurity: Not on file  Transportation Needs: Not on file  Physical Activity: Not on file  Stress: Not on file  Social Connections: Not on file  Intimate Partner Violence: Not on file   Review of Systems No leg weakness No change in bowel or bladder function Also gets "electric shock" type pain every 6 months or so---right arm (goes back many years) No blurry vision or diplopia No other obvious neurologic symptoms Lots of stress at [employer] and also works on the side    Objective:   Physical Exam Constitutional:      Appearance: Normal appearance.  Abdominal:     Palpations: Abdomen is soft.     Tenderness: There is no abdominal tenderness. There is no guarding or rebound.     Hernia: No  hernia is present.  Genitourinary:    Testes: Normal.     Comments: Scrotum is quiet No hernia Musculoskeletal:     Comments: Normal ROM in hips SLR negative  Neurological:     Mental Status: He is alert.     Comments: No leg weakness            Assessment & Plan:

## 2020-10-05 MED ORDER — SILDENAFIL CITRATE 20 MG PO TABS: ORAL_TABLET | ORAL | 0 refills | Status: DC

## 2020-11-04 ENCOUNTER — Ambulatory Visit (INDEPENDENT_AMBULATORY_CARE_PROVIDER_SITE_OTHER): Payer: BC Managed Care – PPO | Admitting: Internal Medicine

## 2020-11-04 ENCOUNTER — Encounter: Payer: Self-pay | Admitting: Internal Medicine

## 2020-11-04 ENCOUNTER — Other Ambulatory Visit: Payer: Self-pay

## 2020-11-04 VITALS — BP 120/84 | HR 84 | Temp 98.2°F | Ht 73.5 in | Wt 226.0 lb

## 2020-11-04 DIAGNOSIS — Z0000 Encounter for general adult medical examination without abnormal findings: Secondary | ICD-10-CM | POA: Diagnosis not present

## 2020-11-04 DIAGNOSIS — Z1211 Encounter for screening for malignant neoplasm of colon: Secondary | ICD-10-CM | POA: Diagnosis not present

## 2020-11-04 MED ORDER — SILDENAFIL CITRATE 20 MG PO TABS: ORAL_TABLET | ORAL | 5 refills | Status: DC

## 2020-11-04 NOTE — Patient Instructions (Signed)
https://www.nhlbi.nih.gov/files/docs/public/heart/dash_brief.pdf">  DASH Eating Plan DASH stands for Dietary Approaches to Stop Hypertension. The DASH eating plan is a healthy eating plan that has been shown to: Reduce high blood pressure (hypertension). Reduce your risk for type 2 diabetes, heart disease, and stroke. Help with weight loss. What are tips for following this plan? Reading food labels Check food labels for the amount of salt (sodium) per serving. Choose foods with less than 5 percent of the Daily Value of sodium. Generally, foods with less than 300 milligrams (mg) of sodium per serving fit into this eating plan. To find whole grains, look for the word "whole" as the first word in the ingredient list. Shopping Buy products labeled as "low-sodium" or "no salt added." Buy fresh foods. Avoid canned foods and pre-made or frozen meals. Cooking Avoid adding salt when cooking. Use salt-free seasonings or herbs instead of table salt or sea salt. Check with your health care provider or pharmacist before using salt substitutes. Do not fry foods. Cook foods using healthy methods such as baking, boiling, grilling, roasting, and broiling instead. Cook with heart-healthy oils, such as olive, canola, avocado, soybean, or sunflower oil. Meal planning  Eat a balanced diet that includes: 4 or more servings of fruits and 4 or more servings of vegetables each day. Try to fill one-half of your plate with fruits and vegetables. 6-8 servings of whole grains each day. Less than 6 oz (170 g) of lean meat, poultry, or fish each day. A 3-oz (85-g) serving of meat is about the same size as a deck of cards. One egg equals 1 oz (28 g). 2-3 servings of low-fat dairy each day. One serving is 1 cup (237 mL). 1 serving of nuts, seeds, or beans 5 times each week. 2-3 servings of heart-healthy fats. Healthy fats called omega-3 fatty acids are found in foods such as walnuts, flaxseeds, fortified milks, and eggs.  These fats are also found in cold-water fish, such as sardines, salmon, and mackerel. Limit how much you eat of: Canned or prepackaged foods. Food that is high in trans fat, such as some fried foods. Food that is high in saturated fat, such as fatty meat. Desserts and other sweets, sugary drinks, and other foods with added sugar. Full-fat dairy products. Do not salt foods before eating. Do not eat more than 4 egg yolks a week. Try to eat at least 2 vegetarian meals a week. Eat more home-cooked food and less restaurant, buffet, and fast food.  Lifestyle When eating at a restaurant, ask that your food be prepared with less salt or no salt, if possible. If you drink alcohol: Limit how much you use to: 0-1 drink a day for women who are not pregnant. 0-2 drinks a day for men. Be aware of how much alcohol is in your drink. In the [place] one drink equals one 12 oz bottle of beer (355 mL), one 5 oz glass of wine (148 mL), or one 1 oz glass of hard liquor (44 mL). General information Avoid eating more than 2,300 mg of salt a day. If you have hypertension, you may need to reduce your sodium intake to 1,500 mg a day. Work with your health care provider to maintain a healthy body weight or to lose weight. Ask what an ideal weight is for you. Get at least 30 minutes of exercise that causes your heart to beat faster (aerobic exercise) most days of the week. Activities may include walking, swimming, or biking. Work with your health care provider   or dietitian to adjust your eating plan to your individual calorie needs. What foods should I eat? Fruits All fresh, dried, or frozen fruit. Canned fruit in natural juice (without addedsugar). Vegetables Fresh or frozen vegetables (raw, steamed, roasted, or grilled). Low-sodium or reduced-sodium tomato and vegetable juice. Low-sodium or reduced-sodium tomatosauce and tomato paste. Low-sodium or reduced-sodium canned vegetables. Grains Whole-grain or  whole-wheat bread. Whole-grain or whole-wheat pasta. Brown rice. Oatmeal. Quinoa. Bulgur. Whole-grain and low-sodium cereals. Pita bread.Low-fat, low-sodium crackers. Whole-wheat flour tortillas. Meats and other proteins Skinless chicken or turkey. Ground chicken or turkey. Pork with fat trimmed off. Fish and seafood. Egg whites. Dried beans, peas, or lentils. Unsalted nuts, nut butters, and seeds. Unsalted canned beans. Lean cuts of beef with fat trimmed off. Low-sodium, lean precooked or cured meat, such as sausages or meatloaves. Dairy Low-fat (1%) or fat-free (skim) milk. Reduced-fat, low-fat, or fat-free cheeses. Nonfat, low-sodium ricotta or cottage cheese. Low-fat or nonfatyogurt. Low-fat, low-sodium cheese. Fats and oils Soft margarine without trans fats. Vegetable oil. Reduced-fat, low-fat, or light mayonnaise and salad dressings (reduced-sodium). Canola, safflower, olive, avocado, soybean, andsunflower oils. Avocado. Seasonings and condiments Herbs. Spices. Seasoning mixes without salt. Other foods Unsalted popcorn and pretzels. Fat-free sweets. The items listed above may not be a complete list of foods and beverages you can eat. Contact a dietitian for more information. What foods should I avoid? Fruits Canned fruit in a light or heavy syrup. Fried fruit. Fruit in cream or buttersauce. Vegetables Creamed or fried vegetables. Vegetables in a cheese sauce. Regular canned vegetables (not low-sodium or reduced-sodium). Regular canned tomato sauce and paste (not low-sodium or reduced-sodium). Regular tomato and vegetable juice(not low-sodium or reduced-sodium). Pickles. Olives. Grains Baked goods made with fat, such as croissants, muffins, or some breads. Drypasta or rice meal packs. Meats and other proteins Fatty cuts of meat. Ribs. Fried meat. Bacon. Bologna, salami, and other precooked or cured meats, such as sausages or meat loaves. Fat from the back of a pig (fatback). Bratwurst.  Salted nuts and seeds. Canned beans with added salt. Canned orsmoked fish. Whole eggs or egg yolks. Chicken or turkey with skin. Dairy Whole or 2% milk, cream, and half-and-half. Whole or full-fat cream cheese. Whole-fat or sweetened yogurt. Full-fat cheese. Nondairy creamers. Whippedtoppings. Processed cheese and cheese spreads. Fats and oils Butter. Stick margarine. Lard. Shortening. Ghee. Bacon fat. Tropical oils, suchas coconut, palm kernel, or palm oil. Seasonings and condiments Onion salt, garlic salt, seasoned salt, table salt, and sea salt. Worcestershire sauce. Tartar sauce. Barbecue sauce. Teriyaki sauce. Soy sauce, including reduced-sodium. Steak sauce. Canned and packaged gravies. Fish sauce. Oyster sauce. Cocktail sauce. Store-bought horseradish. Ketchup. Mustard. Meat flavorings and tenderizers. Bouillon cubes. Hot sauces. Pre-made or packaged marinades. Pre-made or packaged taco seasonings. Relishes. Regular saladdressings. Other foods Salted popcorn and pretzels. The items listed above may not be a complete list of foods and beverages you should avoid. Contact a dietitian for more information. Where to find more information National Heart, Lung, and Blood Institute: www.nhlbi.nih.gov American Heart Association: www.heart.org Academy of Nutrition and Dietetics: www.eatright.org National Kidney Foundation: www.kidney.org Summary The DASH eating plan is a healthy eating plan that has been shown to reduce high blood pressure (hypertension). It may also reduce your risk for type 2 diabetes, heart disease, and stroke. When on the DASH eating plan, aim to eat more fresh fruits and vegetables, whole grains, lean proteins, low-fat dairy, and heart-healthy fats. With the DASH eating plan, you should limit salt (sodium) intake to 2,300   mg a day. If you have hypertension, you may need to reduce your sodium intake to 1,500 mg a day. Work with your health care provider or dietitian to adjust  your eating plan to your individual calorie needs. This information is not intended to replace advice given to you by your health care provider. Make sure you discuss any questions you have with your healthcare provider. Document Revised: 02/08/2019 Document Reviewed: 02/08/2019 Elsevier Patient Education  2022 Elsevier Inc.  

## 2020-11-04 NOTE — Progress Notes (Signed)
Subjective:    Patient ID: Jeffrey Cross, male    DOB: 06-21-1975, 45 y.o.   MRN: [id number]  HPI Here for physical This visit occurred during the SARS-CoV-2 public health emergency.  Safety protocols were in place, including screening questions prior to the visit, additional usage of staff PPE, and extensive cleaning of exam room while observing appropriate contact time as indicated for disinfecting solutions.   Did have an intense groin pain for 1 minute a month after our last visit Now it is mostly resolved--occasional mild ache  Did get COVID about 5 weeks ago Was at the beach---only mild symptoms  Current Outpatient Medications on File Prior to Visit  Medication Sig Dispense Refill   sildenafil (REVATIO) 20 MG tablet TAKE 3 TO 5 TABLETS BY MOUTH DAILY AS NEEDED 50 tablet 0   No current facility-administered medications on file prior to visit.    Allergies  Allergen Reactions   Amoxicillin-Pot Clavulanate Hives and Rash    rash rash    Past Medical History:  Diagnosis Date   Prostatitis 09/2011    Past Surgical History:  Procedure Laterality Date   NASAL SINUS SURGERY  3/09    Family History  Problem Relation Age of Onset   Healthy Mother    Healthy Father    Diabetes Other        Aunt   Prostate cancer Paternal Grandfather     Social History   Socioeconomic History   Marital status: Married    Spouse name: Not on file   Number of children: 2   Years of education: Not on file   Highest education level: Not on file  Occupational History   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  Tobacco Use   Smoking status: Former    Packs/day: 1.50    Types: Cigarettes    Quit date: 06/28/2011    Years since quitting: 9.3   Smokeless tobacco: Never   Tobacco comments:    using electronic cigarettes  Substance and Sexual Activity   Alcohol use: Yes    Comment: Rare   Drug use: No   Sexual activity: Not on file  Other Topics Concern    Not on file  Social History Narrative   Married; 2 children--1 with wife and one with shared custody with the mother      Plumber--needs CDL for overweight vehicle            Social Determinants of Health   Financial Resource Strain: Not on file  Food Insecurity: Not on file  Transportation Needs: Not on file  Physical Activity: Not on file  Stress: Not on file  Social Connections: Not on file  Intimate Partner Violence: Not on file   Review of Systems  Constitutional:  Negative for fatigue and unexpected weight change.       Physically active at work--no set exercise (plans to go to gym again) Wears seat belt  HENT:  Negative for dental problem, hearing loss and tinnitus.        Keeps up with dentist  Eyes:  Negative for visual disturbance.       No diplopia or unilateral vision loss  Respiratory:  Negative for cough, chest tightness and shortness of breath.   Cardiovascular:  Negative for chest pain, palpitations and leg swelling.  Gastrointestinal:  Negative for blood in stool and constipation.       Rare heartburn---uses OTC omeprazole  Endocrine: Negative for  polydipsia and polyuria.  Genitourinary:  Negative for difficulty urinating and urgency.       Sildenafil effective for sex  Musculoskeletal:  Negative for arthralgias, back pain and joint swelling.  Skin:  Negative for rash.       No suspicious skin lesions  Allergic/Immunologic: Negative for environmental allergies and immunocompromised state.  Neurological:  Negative for dizziness, syncope, light-headedness and headaches.  Hematological:  Negative for adenopathy. Does not bruise/bleed easily.  Psychiatric/Behavioral:  Negative for dysphoric mood and sleep disturbance. The patient is not nervous/anxious.       Objective:   Physical Exam Constitutional:      Appearance: Normal appearance.  HENT:     Right Ear: Tympanic membrane and ear canal normal.     Left Ear: Tympanic membrane and ear canal normal.   Eyes:     Conjunctiva/sclera: Conjunctivae normal.     Pupils: Pupils are equal, round, and reactive to light.  Cardiovascular:     Rate and Rhythm: Normal rate and regular rhythm.     Pulses: Normal pulses.     Heart sounds: No murmur heard.   No gallop.  Pulmonary:     Effort: Pulmonary effort is normal.     Breath sounds: Normal breath sounds. No wheezing or rales.  Abdominal:     Palpations: Abdomen is soft.     Tenderness: There is no abdominal tenderness.  Musculoskeletal:     Cervical back: Neck supple.     Right lower leg: No edema.     Left lower leg: No edema.  Lymphadenopathy:     Cervical: No cervical adenopathy.  Skin:    General: Skin is warm.     Findings: No rash.  Neurological:     General: No focal deficit present.     Mental Status: He is alert and oriented to person, place, and time.  Psychiatric:        Mood and Affect: Mood normal.        Behavior: Behavior normal.           Assessment & Plan:

## 2020-11-04 NOTE — Assessment & Plan Note (Signed)
Healthy Discussed fitness and DASH eating FIT after discussion No PSA till 69 Bivalent COVID booster when available and flu vaccine

## 2020-11-13 ENCOUNTER — Other Ambulatory Visit (INDEPENDENT_AMBULATORY_CARE_PROVIDER_SITE_OTHER): Payer: BC Managed Care – PPO

## 2020-11-13 DIAGNOSIS — Z1211 Encounter for screening for malignant neoplasm of colon: Secondary | ICD-10-CM | POA: Diagnosis not present

## 2020-11-13 LAB — FECAL OCCULT BLOOD, IMMUNOCHEMICAL: Fecal Occult Bld: NEGATIVE

## 2021-11-09 NOTE — Progress Notes (Unsigned)
    Jeffrey Cross T. Jeffrey Omdahl, MD, CAQ Sports Medicine [facility] at [facility] [address] [place], [address]  Phone: [phone]  FAX: [phone]  Jeffrey Cross - 46 y.o. male  MRN [id number]  Date of Birth: 08-08-1975  Date: 11/10/2021  PCP: Jeffrey Schwalbe, MD  Referral: Jeffrey Schwalbe, MD  No chief complaint on file.  Subjective:   Jeffrey Cross is a 46 y.o. very pleasant male patient with There is no height or weight on file to calculate BMI. who presents with the following:  Very pleasant generally healthy 46 year old presents with some acute neck pain.    Review of Systems is noted in the HPI, as appropriate  Objective:   There were no vitals taken for this visit.  GEN: No acute distress; alert,appropriate. PULM: Breathing comfortably in no respiratory distress PSYCH: Normally interactive.   Laboratory and Imaging Data:  Assessment and Plan:   ***

## 2021-11-10 ENCOUNTER — Encounter: Payer: Self-pay | Admitting: Family Medicine

## 2021-11-10 ENCOUNTER — Ambulatory Visit (INDEPENDENT_AMBULATORY_CARE_PROVIDER_SITE_OTHER): Payer: BC Managed Care – PPO | Admitting: Family Medicine

## 2021-11-10 VITALS — BP 118/82 | HR 68 | Temp 98.9°F | Ht 73.5 in | Wt 224.0 lb

## 2021-11-10 DIAGNOSIS — M25512 Pain in left shoulder: Secondary | ICD-10-CM

## 2021-11-10 DIAGNOSIS — M5412 Radiculopathy, cervical region: Secondary | ICD-10-CM | POA: Diagnosis not present

## 2021-11-10 MED ORDER — PREDNISONE 20 MG PO TABS: ORAL_TABLET | ORAL | 0 refills | Status: DC

## 2021-11-10 MED ORDER — GABAPENTIN 300 MG PO CAPS: 300.0000 mg | ORAL_CAPSULE | Freq: Every day | ORAL | 1 refills | Status: DC

## 2021-11-26 IMAGING — DX DG FINGER INDEX 2+V*R*
3 series · 3 of 3 positions shown · non-contrast
Comparison: None

CLINICAL DATA: Penetrating trauma RIGHT index finger

EXAM:
RIGHT INDEX FINGER 2+V

[finger ap]
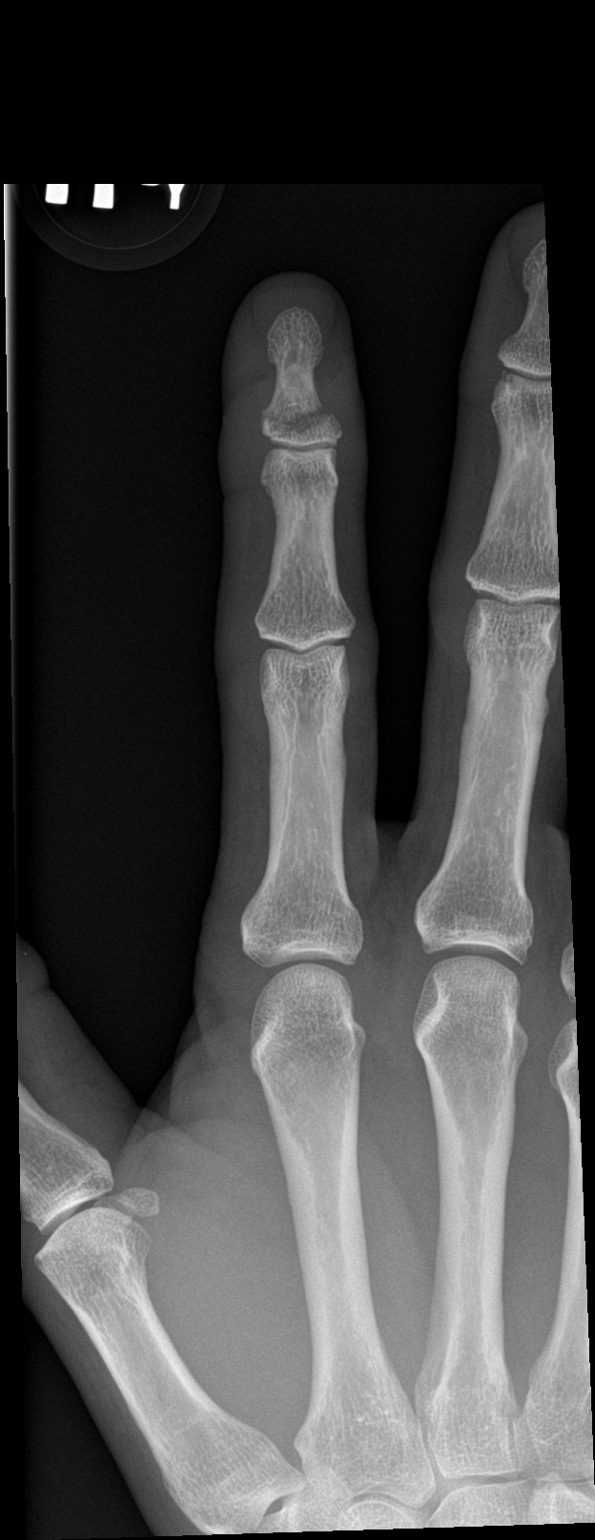

[finger obl]
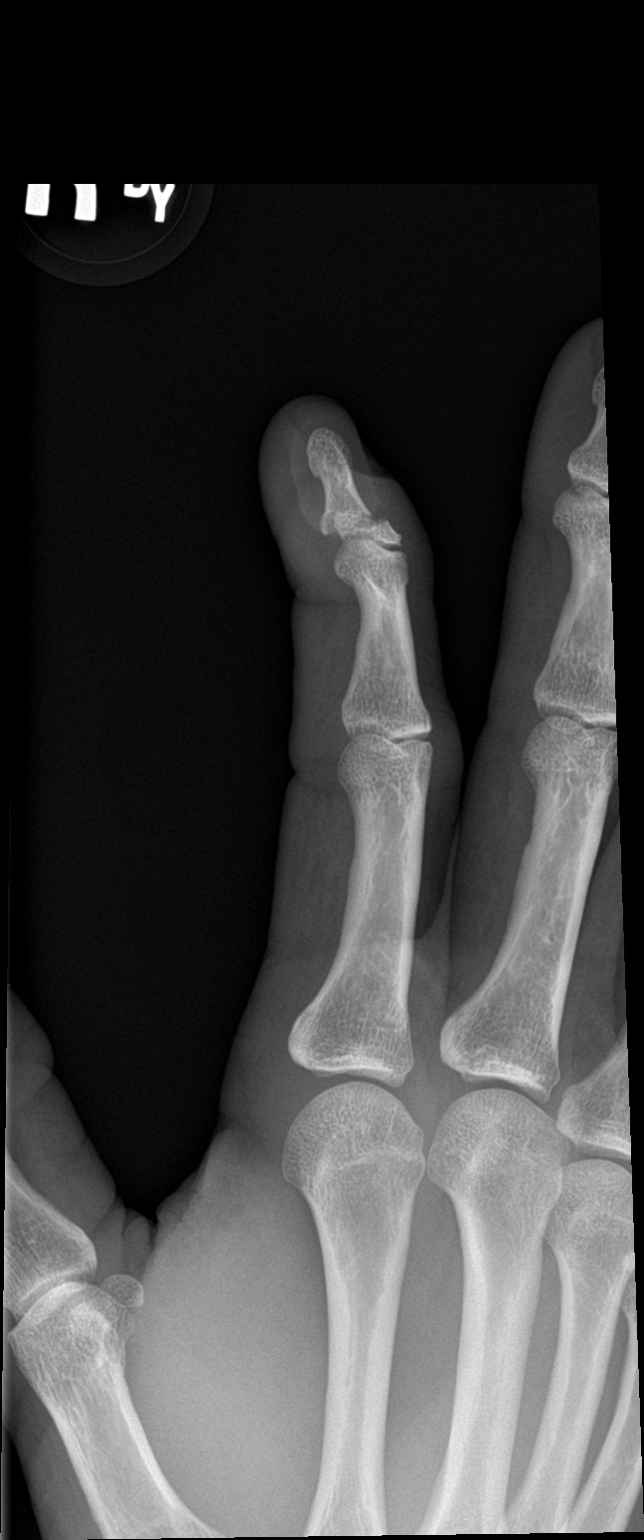

[finger lat]
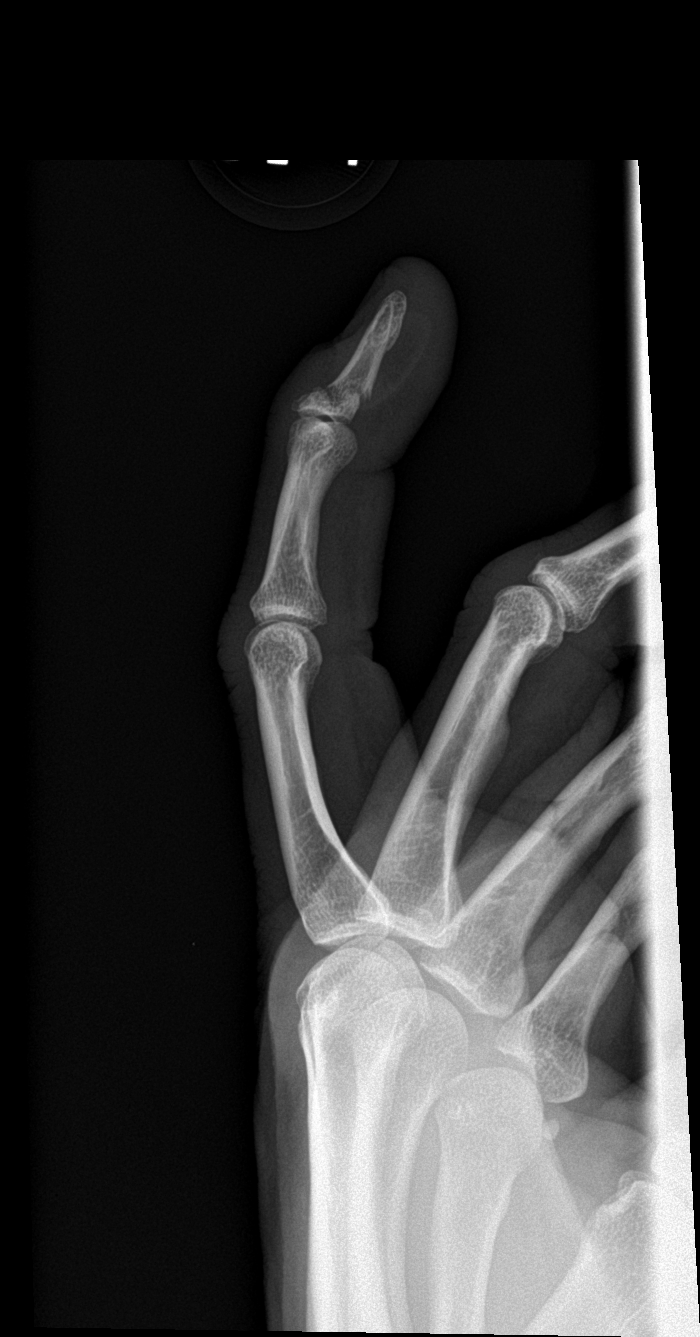

[3 of 3 positions shown; findings below may reference images not displayed]

FINDINGS: Osseous mineralization normal.

Soft tissue swelling at distal phalanx RIGHT index finger.

Joint spaces preserved.

Mildly displaced transverse fracture at base of distal phalanx.

No definite intra-articular extension.

No additional fracture, dislocation, or bone destruction.
IMPRESSION: Displaced transverse fracture at base of distal phalanx RIGHT index
finger.

## 2022-05-03 ENCOUNTER — Encounter: Payer: Self-pay | Admitting: Internal Medicine

## 2022-05-04 MED ORDER — SILDENAFIL CITRATE 100 MG PO TABS: 50.0000 mg | ORAL_TABLET | Freq: Every day | ORAL | 11 refills | Status: DC | PRN

## 2022-10-10 DIAGNOSIS — S0501XA Injury of conjunctiva and corneal abrasion without foreign body, right eye, initial encounter: Secondary | ICD-10-CM | POA: Diagnosis not present

## 2023-01-19 ENCOUNTER — Ambulatory Visit: Payer: BC Managed Care – PPO | Admitting: Internal Medicine

## 2023-01-19 ENCOUNTER — Encounter: Payer: Self-pay | Admitting: Internal Medicine

## 2023-01-19 VITALS — BP 112/76 | HR 88 | Temp 98.7°F | Ht 73.0 in | Wt 231.0 lb

## 2023-01-19 DIAGNOSIS — F439 Reaction to severe stress, unspecified: Secondary | ICD-10-CM

## 2023-01-19 DIAGNOSIS — F900 Attention-deficit hyperactivity disorder, predominantly inattentive type: Secondary | ICD-10-CM | POA: Insufficient documentation

## 2023-01-19 DIAGNOSIS — R41840 Attention and concentration deficit: Secondary | ICD-10-CM

## 2023-01-19 MED ORDER — AMPHETAMINE-DEXTROAMPHETAMINE 10 MG PO TABS: 10.0000 mg | ORAL_TABLET | Freq: Every day | ORAL | 0 refills | Status: DC

## 2023-01-19 NOTE — Assessment & Plan Note (Signed)
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

## 2023-01-19 NOTE — Progress Notes (Signed)
Subjective:    Patient ID: Jeffrey Cross, male    DOB: Aug 11, 1975, 47 y.o.   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 10 tablet 11   No current facility-administered medications on file prior to visit.    Allergies  Allergen Reactions   Amoxicillin-Pot Clavulanate Hives and Rash    rash rash    Past Medical History:  Diagnosis Date   Prostatitis 09/2011    Past Surgical History:  Procedure Laterality Date   NASAL SINUS SURGERY  3/09    Family History  Problem Relation Age of Onset   Healthy Mother    Healthy Father    Diabetes Other        Aunt   Prostate cancer Paternal Grandfather     Social History    Socioeconomic History   Marital status: Married    Spouse name: Not on file   Number of children: 2   Years of education: Not on file   Highest education level: Not on file  Occupational History   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  Tobacco Use   Smoking status: Former    Current packs/day: 0.00    Types: Cigarettes    Quit date: 06/28/2011    Years since quitting: 11.5   Smokeless tobacco: Never   Tobacco comments:    using electronic cigarettes  Substance and Sexual Activity   Alcohol use: Yes    Comment: Rare   Drug use: No   Sexual activity: Not on file  Other Topics Concern   Not on file  Social History Narrative   Married; 2 children--1 with wife and one with shared custody with the mother      Plumber--needs CDL for overweight vehicle            Social Determinants of Health   Financial Resource Strain: Not on file  Food Insecurity: Not on file  Transportation Needs: Not on file  Physical Activity: Not on file  Stress: Not on file  Social Connections: Not on file  Intimate Partner Violence: Not on file   Review of Systems Not sleeping well---worse since this job change Appetite is okay     Objective:  Physical Exam Constitutional:      Appearance: Normal appearance.  Neurological:     Mental Status: He is alert.  Psychiatric:        Mood and Affect: Mood normal.        Behavior: Behavior normal.            Assessment & Plan:

## 2023-01-19 NOTE — Assessment & Plan Note (Signed)
Not really depressed or anxious---other than when he has to do administrative things or have meetings that he is not best suited for. 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

## 2023-02-08 ENCOUNTER — Encounter: Payer: Self-pay | Admitting: Internal Medicine

## 2023-02-08 ENCOUNTER — Ambulatory Visit: Payer: 59 | Admitting: Internal Medicine

## 2023-02-08 VITALS — BP 110/80 | HR 80 | Temp 98.5°F | Ht 73.5 in | Wt 228.0 lb

## 2023-02-08 DIAGNOSIS — Z1211 Encounter for screening for malignant neoplasm of colon: Secondary | ICD-10-CM

## 2023-02-08 DIAGNOSIS — F900 Attention-deficit hyperactivity disorder, predominantly inattentive type: Secondary | ICD-10-CM | POA: Diagnosis not present

## 2023-02-08 DIAGNOSIS — E785 Hyperlipidemia, unspecified: Secondary | ICD-10-CM | POA: Diagnosis not present

## 2023-02-08 DIAGNOSIS — Z0000 Encounter for general adult medical examination without abnormal findings: Secondary | ICD-10-CM

## 2023-02-08 LAB — CBC
HCT: 45.5 % (ref 39.0–52.0)
Hemoglobin: 15.4 g/dL (ref 13.0–17.0)
MCHC: 33.8 g/dL (ref 30.0–36.0)
MCV: 88.7 fL (ref 78.0–100.0)
Platelets: 326 10*3/uL (ref 150.0–400.0)
RBC: 5.13 Mil/uL (ref 4.22–5.81)
RDW: 13.3 % (ref 11.5–15.5)
WBC: 7.7 10*3/uL (ref 4.0–10.5)

## 2023-02-08 LAB — COMPREHENSIVE METABOLIC PANEL
ALT: 32 U/L (ref 0–53)
AST: 25 U/L (ref 0–37)
Albumin: 4.6 g/dL (ref 3.5–5.2)
Alkaline Phosphatase: 47 U/L (ref 39–117)
BUN: 17 mg/dL (ref 6–23)
CO2: 31 meq/L (ref 19–32)
Calcium: 9.6 mg/dL (ref 8.4–10.5)
Chloride: 100 meq/L (ref 96–112)
Creatinine, Ser: 0.86 mg/dL (ref 0.40–1.50)
GFR: 102.22 mL/min (ref >60.00)
Glucose, Bld: 111 mg/dL — ABNORMAL HIGH (ref 70–99)
Potassium: 3.7 meq/L (ref 3.5–5.1)
Sodium: 139 meq/L (ref 135–145)
Total Bilirubin: 0.4 mg/dL (ref 0.2–1.2)
Total Protein: 7.6 g/dL (ref 6.0–8.3)

## 2023-02-08 LAB — LIPID PANEL
Cholesterol: 193 mg/dL (ref 0–200)
HDL: 41 mg/dL (ref >39.00)
LDL Cholesterol: 120 mg/dL — ABNORMAL HIGH (ref 0–99)
NonHDL: 152.21
Total CHOL/HDL Ratio: 5
Triglycerides: 162 mg/dL — ABNORMAL HIGH (ref 0.0–149.0)
VLDL: 32.4 mg/dL (ref 0.0–40.0)

## 2023-02-08 NOTE — Assessment & Plan Note (Signed)
His lifelong LD likely ADHD Has done well with the adderall for meetings/financial things

## 2023-02-08 NOTE — Assessment & Plan Note (Signed)
Healthy Discussed healthy eating Prefers no flu/COVID vaccines FIT

## 2023-02-08 NOTE — Assessment & Plan Note (Signed)
Mildly elevated in past Will recheck

## 2023-02-08 NOTE — Progress Notes (Signed)
Subjective:    Patient ID: Jeffrey Cross, male    DOB: [birth year], 47 y.o.   MRN: [id number]  HPI Here for physical  Is using the adderall for meetings Really helping him focus Able to study things and do better in the business meetings Usually just needs 10mg  in any day  No exercise other than work--stays physically active Is trying to be careful with eating--cut out sweet tea  Current Outpatient Medications on File Prior to Visit  Medication Sig Dispense Refill   amphetamine-dextroamphetamine (ADDERALL) 10 MG tablet Take 1-2 tablets (10-20 mg total) by mouth daily with breakfast. 60 tablet 0   sildenafil (VIAGRA) 100 MG tablet Take 0.5-1 tablets (50-100 mg total) by mouth daily as needed for erectile dysfunction. 10 tablet 11   No current facility-administered medications on file prior to visit.    Allergies  Allergen Reactions   Amoxicillin-Pot Clavulanate Hives and Rash    rash rash    Past Medical History:  Diagnosis Date   Prostatitis 09/2011    Past Surgical History:  Procedure Laterality Date   NASAL SINUS SURGERY  3/09    Family History  Problem Relation Age of Onset   Healthy Mother    Healthy Father    Diabetes Other        Aunt   Prostate cancer Paternal Grandfather     Social History   Socioeconomic History   Marital status: Married    Spouse name: Not on file   Number of children: 2   Years of education: Not on file   Highest education level: Not on file  Occupational History   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  Tobacco Use   Smoking status: Former    Current packs/day: 0.00    Types: Cigarettes    Quit date: 06/28/2011    Years since quitting: 11.6   Smokeless tobacco: Never   Tobacco comments:    using electronic cigarettes  Substance and Sexual Activity   Alcohol use: Yes    Comment: Rare   Drug use: No   Sexual activity: Not on file  Other Topics Concern   Not on file  Social History  Narrative   Married; 2 children--1 with wife and one with shared custody with the mother      Plumber--needs CDL for overweight vehicle            Social Determinants of Health   Financial Resource Strain: Not on file  Food Insecurity: Not on file  Transportation Needs: Not on file  Physical Activity: Not on file  Stress: Not on file  Social Connections: Not on file  Intimate Partner Violence: Not on file   Review of Systems  Constitutional:  Negative for fatigue and unexpected weight change.       Wears seat belt  HENT:  Negative for dental problem, hearing loss and trouble swallowing.        Mild tinnitus with recent respiratory infection--some residual green nasal discharge Keeps up with dentist  Eyes:  Negative for visual disturbance.       No diplopia or unilateral vision loss  Respiratory:  Negative for cough, chest tightness and shortness of breath.   Cardiovascular:  Negative for chest pain, palpitations and leg swelling.  Gastrointestinal:  Negative for blood in stool and constipation.       Some heartburn--burps and it resolves  Endocrine: Negative for polydipsia and polyuria.  Genitourinary:  Negative for  difficulty urinating and urgency.       Satisfied with sildenafil  Musculoskeletal:  Negative for arthralgias, back pain and joint swelling.  Skin:  Negative for rash.  Allergic/Immunologic: Negative for environmental allergies and immunocompromised state.  Neurological:  Negative for dizziness, syncope, light-headedness and headaches.  Hematological:  Negative for adenopathy. Does not bruise/bleed easily.  Psychiatric/Behavioral:  Negative for dysphoric mood.        Has night awakening ---"wild dreams"        Objective:   Physical Exam Constitutional:      Appearance: Normal appearance.  HENT:     Mouth/Throat:     Pharynx: No oropharyngeal exudate or posterior oropharyngeal erythema.  Eyes:     Conjunctiva/sclera: Conjunctivae normal.     Pupils:  Pupils are equal, round, and reactive to light.  Cardiovascular:     Rate and Rhythm: Normal rate and regular rhythm.     Pulses: Normal pulses.     Heart sounds: No murmur heard.    No gallop.  Pulmonary:     Effort: Pulmonary effort is normal.     Breath sounds: Normal breath sounds. No wheezing or rales.  Abdominal:     Palpations: Abdomen is soft.     Tenderness: There is no abdominal tenderness.  Musculoskeletal:     Cervical back: Neck supple.     Right lower leg: No edema.     Left lower leg: No edema.  Lymphadenopathy:     Cervical: No cervical adenopathy.  Skin:    Findings: No lesion or rash.  Neurological:     General: No focal deficit present.     Mental Status: He is alert and oriented to person, place, and time.  Psychiatric:        Mood and Affect: Mood normal.        Behavior: Behavior normal.            Assessment & Plan:

## 2023-03-10 ENCOUNTER — Other Ambulatory Visit: Payer: Self-pay | Admitting: Internal Medicine

## 2023-03-10 ENCOUNTER — Other Ambulatory Visit: Payer: Self-pay

## 2023-03-10 DIAGNOSIS — Z1211 Encounter for screening for malignant neoplasm of colon: Secondary | ICD-10-CM

## 2023-03-10 MED ORDER — AMPHETAMINE-DEXTROAMPHETAMINE 10 MG PO TABS: 10.0000 mg | ORAL_TABLET | Freq: Every day | ORAL | 0 refills | Status: DC

## 2023-03-13 LAB — FECAL OCCULT BLOOD, IMMUNOCHEMICAL: Fecal Occult Bld: NEGATIVE

## 2023-05-03 ENCOUNTER — Other Ambulatory Visit: Payer: Self-pay | Admitting: Internal Medicine

## 2023-05-04 MED ORDER — AMPHETAMINE-DEXTROAMPHETAMINE 10 MG PO TABS: 10.0000 mg | ORAL_TABLET | Freq: Every day | ORAL | 0 refills | Status: DC

## 2023-05-04 NOTE — Telephone Encounter (Signed)
Patient comment: This has worked very well. I was finally able to pass my exam to get Recruitment consultant!!!   Last filled 03-10-23 #10 Last OV 02-08-23 No Future OV [facility]

## 2023-05-11 ENCOUNTER — Ambulatory Visit: Payer: 59 | Admitting: Internal Medicine

## 2023-06-05 ENCOUNTER — Encounter: Payer: Self-pay | Admitting: Internal Medicine

## 2023-06-05 ENCOUNTER — Ambulatory Visit: Admitting: Internal Medicine

## 2023-06-05 VITALS — BP 122/80 | HR 78 | Temp 98.1°F | Ht 73.5 in | Wt 233.0 lb

## 2023-06-05 DIAGNOSIS — M19041 Primary osteoarthritis, right hand: Secondary | ICD-10-CM | POA: Insufficient documentation

## 2023-06-05 DIAGNOSIS — M19042 Primary osteoarthritis, left hand: Secondary | ICD-10-CM | POA: Diagnosis not present

## 2023-06-05 MED ORDER — SILDENAFIL CITRATE 100 MG PO TABS: 50.0000 mg | ORAL_TABLET | Freq: Every day | ORAL | 11 refills | Status: AC | PRN

## 2023-06-05 NOTE — Patient Instructions (Signed)
 Please try the topical diclofenac on the painful joint--and use the oral ibuprofen when you are going to use your hands a lot.

## 2023-06-05 NOTE — Addendum Note (Signed)
 Addended by: Tillman Abide I on: 06/05/2023 10:28 AM   Modules accepted: Orders

## 2023-06-05 NOTE — Progress Notes (Addendum)
 Subjective:    Patient ID: Jeffrey Cross, male    DOB: 1975-12-09, 48 y.o.   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 60 tablet 0   sildenafil (VIAGRA) 100 MG tablet Take 0.5-1 tablets (50-100 mg total) by mouth daily as needed for erectile dysfunction. 10 tablet 11   No current facility-administered medications on file prior to visit.    Allergies  Allergen Reactions   Amoxicillin-Pot Clavulanate Hives and Rash    rash rash    Past Medical History:  Diagnosis Date   Prostatitis 09/2011    Past Surgical History:  Procedure Laterality Date   NASAL SINUS SURGERY  3/09    Family History  Problem Relation Age of Onset   Healthy Mother    Coronary artery disease Father    Prostate cancer Paternal Grandfather    Diabetes Other        Aunt    Social History   Socioeconomic History   Marital status: Married    Spouse name: Not on file   Number of children: 2   Years of education: Not on file   Highest education level: Not on file  Occupational History   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  Tobacco Use   Smoking status: Former    Current packs/day: 0.00    Types: Cigarettes    Quit date: 06/28/2011    Years since quitting: 11.9   Smokeless tobacco: Never   Tobacco comments:    using electronic cigarettes   Substance and Sexual Activity   Alcohol use: Yes    Comment: Rare   Drug use: No   Sexual activity: Not on file  Other Topics Concern   Not on file  Social History Narrative   Married; 2 children--1 with wife and one with shared custody with the mother      Plumber--needs CDL for overweight vehicle            Social Drivers of Corporate investment banker Strain: Not on file  Food Insecurity: Not on file  Transportation Needs: Not on file  Physical Activity: Not on file  Stress: Not on file  Social Connections: Not on file  Intimate Partner Violence: Not on file   Review of Systems Some pain in hips About 4 weeks ago--noted a spot on right parietal scalp. Does seem to have gotten smaller in past week or so     Objective:   Physical Exam Constitutional:      Appearance: Normal appearance.  HENT:     Head:     Comments: Apparent seborrheic keratosis on right parietal scalp----pictures show raised area but now flat and not suspicious Musculoskeletal:     Comments: No synovitis in hand joints other than swelling/tenderness on volar surface of left thumb PIP  Neurological:     Mental  Status: He is alert.            Assessment & Plan:

## 2023-06-05 NOTE — Assessment & Plan Note (Signed)
 From decades of work as Nutritional therapist Mostly left thumb PIP Discussed using ibuprofen 600-800mg  on work days (weekends) or more regular tylenol Should try topical diclofenac If worsens, will refer to hand surgeon to consider cortisone injections

## 2023-07-14 ENCOUNTER — Other Ambulatory Visit: Payer: Self-pay | Admitting: Internal Medicine

## 2023-07-17 MED ORDER — AMPHETAMINE-DEXTROAMPHETAMINE 10 MG PO TABS: 10.0000 mg | ORAL_TABLET | Freq: Every day | ORAL | 0 refills | Status: DC

## 2023-07-17 NOTE — Telephone Encounter (Signed)
 Last written 05-04-23 #60 Last OV 06-05-23 Next OV 08-07-23 [facility]

## 2023-07-31 ENCOUNTER — Encounter (HOSPITAL_COMMUNITY): Payer: Self-pay

## 2023-08-06 NOTE — Progress Notes (Signed)
     Mellie Buccellato T. Aaima Gaddie, MD, CAQ Sports Medicine [facility] [address] [place], [address]  Phone: [phone]  FAX: [phone]  Jeffrey Cross - 48 y.o. male  MRN [id number]  Date of Birth: 26-Nov-1975  Date: 08/07/2023  PCP: Helaine Llanos, MD  Referral: Helaine Llanos, MD  Chief Complaint  Patient presents with   Hand Pain    Left Thumb   Subjective:   Jeffrey Cross is a 48 y.o. very pleasant male patient with Body mass index is 29.22 kg/m. who presents with the following:  Patient has pain and restriction of motion on the left first digit through the course of the thumb, predominantly volarly.  He has some restriction of motion with terminal motion, and he does have some clicking and popping and triggering of the digit at times.  Does have a painful nodule at the base of the thumb  L 1st digit trigger finger Flexor tenosynovitis Wants to give it time for now  Review of Systems is noted in the HPI, as appropriate  Objective:   BP 128/80   Pulse 77   Temp 98.2 F (36.8 C) (Temporal)   Ht 6' 1.5" (1.867 m)   Wt 224 lb 8 oz (101.8 kg)   SpO2 99%   BMI 29.22 kg/m   GEN: No acute distress; alert,appropriate. PULM: Breathing comfortably in no respiratory distress PSYCH: Normally interactive.   Diffuse arthritic changes of the hands He does have a palpable nodule at the first MCP on the left.  Does cause pain and he has some pain along the course of the flexor tendon surface. Otherwise neurovascularly intact  Laboratory and Imaging Data:  Assessment and Plan:     ICD-10-CM   1. Trigger thumb, left thumb  M65.312     2. Flexor tenosynovitis of thumb  M65.949      We talked about options.  I will often inject these, but right now he does not want to do that.  It is not unreasonable to give this some additional time to see if it resolves.  He is going to follow-up with 1 month if has not  improved.   Disposition: No follow-ups on file.  Dragon [employer] One speech-to-text software was used for transcription in this dictation.  Possible transcriptional errors can occur using Animal nutritionist.   Signed,  Ranny Bye. Sabine Tenenbaum, MD   Outpatient Encounter Medications as of 08/07/2023  Medication Sig   amphetamine -dextroamphetamine  (ADDERALL) 10 MG tablet Take 1-2 tablets (10-20 mg total) by mouth daily with breakfast.   sildenafil  (VIAGRA ) 100 MG tablet Take 0.5-1 tablets (50-100 mg total) by mouth daily as needed for erectile dysfunction.   No facility-administered encounter medications on file as of 08/07/2023.

## 2023-08-07 ENCOUNTER — Ambulatory Visit (INDEPENDENT_AMBULATORY_CARE_PROVIDER_SITE_OTHER): Payer: Self-pay | Admitting: Family Medicine

## 2023-08-07 ENCOUNTER — Encounter: Payer: Self-pay | Admitting: Family Medicine

## 2023-08-07 VITALS — BP 128/80 | HR 77 | Temp 98.2°F | Ht 73.5 in | Wt 224.5 lb

## 2023-08-07 DIAGNOSIS — M65312 Trigger thumb, left thumb: Secondary | ICD-10-CM

## 2023-08-07 DIAGNOSIS — M65949 Unspecified synovitis and tenosynovitis, unspecified hand: Secondary | ICD-10-CM

## 2023-09-06 ENCOUNTER — Other Ambulatory Visit: Payer: Self-pay | Admitting: Internal Medicine

## 2023-09-06 NOTE — Telephone Encounter (Signed)
 Copied from CRM [id number]. Topic: Clinical - Medication Refill >> Sep 06, 2023  9:31 AM [place] M wrote: Medication: amphetamine -dextroamphetamine  (ADDERALL) 10 MG tablet  Has the patient contacted their pharmacy? Yes (Agent: If no, request that the patient contact the pharmacy for the refill. If patient does not wish to contact the pharmacy document the reason why and proceed with request.) (Agent: If yes, when and what did the pharmacy advise?)  This is the patient's preferred pharmacy:  [facility] [address] - [place], [place] - [address] [facility] [address] [facility] [address] [place] [address] Phone: [phone] Fax: [phone]  Is this the correct pharmacy for this prescription? Yes If no, delete pharmacy and type the correct one.   Has the prescription been filled recently? Yes  Is the patient out of the medication? Yes  Has the patient been seen for an appointment in the last year OR does the patient have an upcoming appointment? Yes  Can we respond through MyChart? Yes  Agent: Please be advised that Rx refills may take up to 3 business days. We ask that you follow-up with your pharmacy.

## 2023-09-07 MED ORDER — AMPHETAMINE-DEXTROAMPHETAMINE 10 MG PO TABS: 10.0000 mg | ORAL_TABLET | Freq: Every day | ORAL | 0 refills | Status: DC

## 2023-09-07 NOTE — Telephone Encounter (Signed)
 Last written 07-17-23 #60 Last OV 08-07-23 with Dr Geralyn Knee No Future OV [facility]

## 2023-10-26 ENCOUNTER — Ambulatory Visit (INDEPENDENT_AMBULATORY_CARE_PROVIDER_SITE_OTHER): Admitting: Internal Medicine

## 2023-10-26 ENCOUNTER — Encounter: Payer: Self-pay | Admitting: Internal Medicine

## 2023-10-26 VITALS — BP 102/70 | HR 77 | Temp 98.2°F | Ht 73.5 in | Wt 218.0 lb

## 2023-10-26 DIAGNOSIS — S39012A Strain of muscle, fascia and tendon of lower back, initial encounter: Secondary | ICD-10-CM

## 2023-10-26 DIAGNOSIS — M545 Low back pain, unspecified: Secondary | ICD-10-CM

## 2023-10-26 LAB — POC URINALSYSI DIPSTICK (AUTOMATED)
Bilirubin, UA: NEGATIVE
Blood, UA: NEGATIVE
Glucose, UA: NEGATIVE
Ketones, UA: NEGATIVE
Leukocytes, UA: NEGATIVE
Nitrite, UA: NEGATIVE
Protein, UA: NEGATIVE
Spec Grav, UA: 1.015 (ref 1.010–1.025)
Urobilinogen, UA: 0.2 U/dL
pH, UA: 6 (ref 5.0–8.0)

## 2023-10-26 MED ORDER — TIZANIDINE HCL 2 MG PO TABS: 2.0000 mg | ORAL_TABLET | Freq: Three times a day (TID) | ORAL | 0 refills | Status: AC | PRN

## 2023-10-26 NOTE — Progress Notes (Signed)
 Subjective:    Patient ID: Jeffrey Cross, male    DOB: 12/06/1975, 48 y.o.   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 60 tablet 0   sildenafil  (VIAGRA ) 100 MG tablet Take 0.5-1 tablets (50-100 mg total) by mouth daily as needed for erectile dysfunction. 10 tablet 11   No current facility-administered medications on file prior to visit.    Allergies  Allergen Reactions   Amoxicillin -Pot Clavulanate Hives and Rash    rash rash    Past Medical History:  Diagnosis Date   Prostatitis 09/2011    Past Surgical History:  Procedure Laterality Date   NASAL SINUS SURGERY  3/09    Family History  Problem Relation Age of Onset   Healthy Mother    Coronary artery disease Father    Prostate cancer Paternal Grandfather    Diabetes Other        Aunt    Social History   Socioeconomic History   Marital status: Married    Spouse name: Not on file   Number of children: 2   Years of education: Not on file   Highest education level: Not on file  Occupational History   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  Tobacco Use   Smoking status: Former    Current packs/day: 0.00    Types: Cigarettes    Quit date: 06/28/2011     Years since quitting: 12.3   Smokeless tobacco: Never   Tobacco comments:    using electronic cigarettes  Substance and Sexual Activity   Alcohol use: Yes    Comment: Rare   Drug use: No   Sexual activity: Not on file  Other Topics Concern   Not on file  Social History Narrative   Married; 2 children--1 with wife and one with shared custody with the mother      Plumber--needs CDL for overweight vehicle            Social Drivers of Corporate investment banker Strain: Not on file  Food Insecurity: Not on file  Transportation Needs: Not on file  Physical Activity: Not on file  Stress: Not on file  Social Connections: Not on file  Intimate Partner Violence: Not on file   Review of Systems No history of kidney stone No hematuria No bowel/bladder trouble No abnormal sensations    Objective:   Physical Exam Constitutional:      Appearance: Normal appearance.  Musculoskeletal:     Comments: Tenderness along right low thoracic/high lumbar paraspinals No spine tenderness SLR negative Normal ROM in hips  Neurological:     Mental Status: He is alert.     Comments: Normal gait and  leg strength            Assessment & Plan:

## 2023-10-26 NOTE — Assessment & Plan Note (Addendum)
 Discussed that this seems to be only muscular Urine normal--reassured that it doesn't seem to be related to his kidney Continue the ibuprofen Ice after working Try lidocaine  topical Tizanidine  2-4mg  tid

## 2023-11-14 ENCOUNTER — Other Ambulatory Visit: Payer: Self-pay | Admitting: Internal Medicine

## 2023-11-14 NOTE — Telephone Encounter (Unsigned)
 Copied from CRM #[id number]. Topic: Clinical - Medication Refill >> Nov 14, 2023  2:13 PM Mesmerise C wrote: Medication: amphetamine -dextroamphetamine  (ADDERALL) 10 MG tablet  Has the patient contacted their pharmacy? No (Agent: If no, request that the patient contact the pharmacy for the refill. If patient does not wish to contact the pharmacy document the reason why and proceed with request.) (Agent: If yes, when and what did the pharmacy advise?)  This is the patient's preferred pharmacy:  [facility] [address] [facility] - [place], [place] - [address] [facility] [address] [facility] [address] [place] [address] Phone: [phone] Fax: [phone]  Is this the correct pharmacy for this prescription? Yes If no, delete pharmacy and type the correct one.   Has the prescription been filled recently? No  Is the patient out of the medication? No  Has the patient been seen for an appointment in the last year OR does the patient have an upcoming appointment? Yes  Can we respond through MyChart? Yes  Agent: Please be advised that Rx refills may take up to 3 business days. We ask that you follow-up with your pharmacy.

## 2023-11-14 NOTE — Telephone Encounter (Signed)
 Last written 09-07-23 #60 Last OV Acute 10-26-23 No Future OV  [facility]

## 2023-11-15 MED ORDER — AMPHETAMINE-DEXTROAMPHETAMINE 10 MG PO TABS: 10.0000 mg | ORAL_TABLET | Freq: Every day | ORAL | 0 refills | Status: DC

## 2023-12-15 ENCOUNTER — Telehealth: Payer: Self-pay

## 2023-12-15 NOTE — Telephone Encounter (Addendum)
 Filled and in CMA box.  Please contact pt to ensure he's still tolerating med well without adverse effects. Please schedule TOC for patient - looks like he's due for CPE after 02/08/2024, I think it'd be ok to shedule CPE with available provider at that time.

## 2023-12-15 NOTE — Telephone Encounter (Signed)
 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 He doe snot have any issues with the medication and no side effects that could effect his driving. 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 I have filled out what needs to be done. Do you mind looking it over and at his chart and if you agree, please sign it and you can give it back to me when done. Thank you. It is is the inbox in your office.

## 2023-12-18 NOTE — Telephone Encounter (Signed)
 Form faxed. Will forward to Admin to get set up with a provider for [facility]

## 2024-01-04 ENCOUNTER — Other Ambulatory Visit: Payer: Self-pay | Admitting: Internal Medicine

## 2024-01-04 NOTE — Telephone Encounter (Unsigned)
 Copied from CRM #[id number]. Topic: Clinical - Medication Refill >> Jan 04, 2024  3:12 PM Drema MATSU wrote: Medication: amphetamine -dextroamphetamine  (ADDERALL) 10 MG tablet  Has the patient contacted their pharmacy? No (Agent: If no, request that the patient contact the pharmacy for the refill. If patient does not wish to contact the pharmacy document the reason why and proceed with request.) (Agent: If yes, when and what did the pharmacy advise?)  This is the patient's preferred pharmacy:  [facility] [address] [facility] - [place], [place] - [address] [facility] AT [facility] [address] [place] [address] Phone: [phone] Fax: [phone]  Is this the correct pharmacy for this prescription? Yes If no, delete pharmacy and type the correct one.   Has the prescription been filled recently? Yes  Is the patient out of the medication? No  Has the patient been seen for an appointment in the last year OR does the patient have an upcoming appointment? Yes  Can we respond through MyChart? Yes  Agent: Please be advised that Rx refills may take up to 3 business days. We ask that you follow-up with your pharmacy.

## 2024-01-05 MED ORDER — AMPHETAMINE-DEXTROAMPHETAMINE 10 MG PO TABS: 10.0000 mg | ORAL_TABLET | Freq: Every day | ORAL | 0 refills | Status: DC

## 2024-02-19 ENCOUNTER — Encounter: Payer: Self-pay | Admitting: Nurse Practitioner

## 2024-02-19 ENCOUNTER — Ambulatory Visit (INDEPENDENT_AMBULATORY_CARE_PROVIDER_SITE_OTHER): Admitting: Nurse Practitioner

## 2024-02-19 VITALS — BP 126/88 | HR 88 | Temp 98.2°F | Ht 72.5 in | Wt 215.2 lb

## 2024-02-19 DIAGNOSIS — Z131 Encounter for screening for diabetes mellitus: Secondary | ICD-10-CM

## 2024-02-19 DIAGNOSIS — Z0000 Encounter for general adult medical examination without abnormal findings: Secondary | ICD-10-CM

## 2024-02-19 DIAGNOSIS — N529 Male erectile dysfunction, unspecified: Secondary | ICD-10-CM

## 2024-02-19 DIAGNOSIS — Z126 Encounter for screening for malignant neoplasm of bladder: Secondary | ICD-10-CM | POA: Diagnosis not present

## 2024-02-19 DIAGNOSIS — E785 Hyperlipidemia, unspecified: Secondary | ICD-10-CM

## 2024-02-19 DIAGNOSIS — Z125 Encounter for screening for malignant neoplasm of prostate: Secondary | ICD-10-CM | POA: Diagnosis not present

## 2024-02-19 DIAGNOSIS — Z79899 Other long term (current) drug therapy: Secondary | ICD-10-CM | POA: Diagnosis not present

## 2024-02-19 DIAGNOSIS — F900 Attention-deficit hyperactivity disorder, predominantly inattentive type: Secondary | ICD-10-CM | POA: Diagnosis not present

## 2024-02-19 DIAGNOSIS — Z87891 Personal history of nicotine dependence: Secondary | ICD-10-CM | POA: Insufficient documentation

## 2024-02-19 DIAGNOSIS — E663 Overweight: Secondary | ICD-10-CM | POA: Diagnosis not present

## 2024-02-19 DIAGNOSIS — Z1211 Encounter for screening for malignant neoplasm of colon: Secondary | ICD-10-CM

## 2024-02-19 MED ORDER — AMPHETAMINE-DEXTROAMPHETAMINE 10 MG PO TABS: 10.0000 mg | ORAL_TABLET | Freq: Every day | ORAL | 0 refills | Status: AC

## 2024-02-19 NOTE — Assessment & Plan Note (Signed)
 Maintained on sildenafil  50 to 100 mg daily as needed sexual intercourse.  Patient is having benefit of medication continue

## 2024-02-19 NOTE — Progress Notes (Signed)
 Established Patient Office Visit  Subjective   Patient ID: Jeffrey Cross, male    DOB: 1976/01/24  Age: 48 y.o. MRN: [id number]  Chief Complaint  Patient presents with   Transitions Of Care    HPI  ADHD: Patient currently maintained on Adderall 10 to 20 mg daily. 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 He does have a history of learning disabliites in high school. States that he was  ED: Patient will take 50 to 100 mg sildenafil  daily as needed sexual intercourse. Has difficulty in getting and maintaining erections. Medication is beneficial    for complete physical and follow up of chronic conditions.  Immunizations: -Tetanus: Completed in 2020 -Influenza: refused  -Shingles: Too young -Pneumonia: Too young  Diet: Fair diet.  He is eating 3 meals and snakcs. He is doing coffee water soda and tea Exercise: No regular exercise. Employment   Eye exam: Completes annually contacts. Tries to go yearly   Dental exam: Completes semi-annually    Colonoscopy: Completed in 03/10/2023 negative fecal occult Lung Cancer Screening: Former smoker, Does no qualify   PSA: Due. Paternal grandfather with dx of prostate CA  Sleep: goes to fall asleep on the couch aorund 930 and get to bed around 1030. He will get up around 430 and feels rested most of the time. He has tried melatonoin       Review of Systems  Constitutional:  Negative for chills and fever.  Respiratory:  Negative for shortness of breath.   Cardiovascular:  Negative for chest pain and leg swelling.  Gastrointestinal:  Negative for abdominal pain, blood in stool, constipation, diarrhea, nausea and vomiting.       BM daily   Genitourinary:  Negative for dysuria and hematuria.  Neurological:  Positive for tingling (right thumb). Negative for dizziness and headaches.  Psychiatric/Behavioral:  Negative for hallucinations and suicidal ideas.        Objective:     BP 126/88   Pulse 88   Temp 98.2 F (36.8 C) (Oral)   Ht 6' 0.5 (1.842 m)   Wt 215 lb 3.2 oz (97.6 kg)   SpO2 96%   BMI 28.79 kg/m  BP Readings from Last 3 Encounters:  02/19/24 126/88  10/26/23 102/70  08/07/23 128/80   Wt Readings from Last 3 Encounters:  02/19/24 215 lb 3.2 oz (97.6 kg)  10/26/23 218 lb (98.9 kg)  08/07/23 224 lb 8 oz (101.8 kg)   SpO2 Readings from Last 3 Encounters:  02/19/24 96%  10/26/23 98%  08/07/23 99%      Physical Exam Vitals and nursing note reviewed.  Constitutional:      Appearance: Normal appearance.  HENT:     Right Ear: Tympanic membrane, ear canal and external ear normal.     Left Ear: Tympanic membrane, ear canal and external ear normal.     Mouth/Throat:     Mouth: Mucous membranes are moist.     Pharynx: Oropharynx is clear.  Eyes:     Extraocular Movements: Extraocular movements intact.     Pupils: Pupils are equal, round, and reactive to light.  Cardiovascular:     Rate and Rhythm: Normal rate and regular rhythm.     Pulses: Normal pulses.     Heart sounds: Normal heart sounds.  Pulmonary:     Effort: Pulmonary effort is normal.     Breath sounds: Normal breath sounds.  Abdominal:     General: Bowel sounds are normal. There is no distension.     Palpations: There is no mass.     Tenderness: There is no abdominal tenderness.     Hernia: No hernia is present.  Genitourinary:    Comments: Deferred  Musculoskeletal:       Arms:     Right lower leg: No edema.     Left lower leg: No edema.  Lymphadenopathy:     Cervical: No cervical adenopathy.  Skin:    General: Skin is warm.  Neurological:     General: No focal deficit present.     Mental Status: He is alert.     Deep Tendon Reflexes:     Reflex Scores:      Bicep reflexes are 2+ on the right side and 2+ on the left side.      Patellar reflexes are 2+ on the right side and 2+ on the left side.    Comments: Bilateral upper and lower  extremity strength 5/5  Psychiatric:        Mood and Affect: Mood normal.        Behavior: Behavior normal.        Thought Content: Thought content normal.        Judgment: Judgment normal.      No results found for any visits on 02/19/24.    The 10-year ASCVD risk score (Arnett DK, et al., 2019) is: 3.6%    Assessment & Plan:   Problem List Items Addressed This Visit       Other   Routine general medical examination at a health care facility - Primary   Discussed age-appropriate immunizations and screening exams.  Did review patient's personal, surgical, social, family histories.  Patient is up-to-date on all age-appropriate vaccinations he would like.  Patient declined flu vaccine today.  iFOB ordered for CRC screening.  PSA ordered today for prostate cancer screening.  Patient was given information at discharge about preventative healthcare maintenance with anticipatory guidance.      Relevant Orders   CBC with Differential/Platelet   Comprehensive metabolic panel with GFR   TSH   Erectile dysfunction   Maintained on sildenafil  50 to 100 mg daily as needed sexual intercourse.  Patient is having benefit of medication continue      ADHD (attention deficit hyperactivity disorder), inattentive type   Maintained on Adderall 10 to 20 mg daily.  Beneficial.  PDMP reviewed.  UDS updated today along with nonopioid controlled substance agreement.  Refills provided      Relevant Orders   DRUG MONITORING, PANEL 8 WITH CONFIRMATION, URINE   Hyperlipidemia   History of same pending lipid panel      Relevant Orders   Hemoglobin A1c   Lipid panel   Former smoker   Pending urine microscopy to rule out microscopic hematuria does not qualify for LDCT currently      Relevant Orders   Urine Microscopic   Overweight   Pending TSH, A1c, lipid panel.  Patient has lost weight since previous office visits      Relevant Orders   Hemoglobin A1c   Lipid panel   Other Visit  Diagnoses       Screening for bladder cancer       Relevant Orders   Urine Microscopic     Screening for diabetes mellitus       Relevant Orders   Hemoglobin A1c     Screening for colon cancer  Relevant Orders   Fecal occult blood, imunochemical     Screening for prostate cancer       Relevant Orders   PSA       Return in about 6 months (around 08/19/2024) for ADHD medication .    Adina Crandall, NP

## 2024-02-19 NOTE — Patient Instructions (Signed)
Nice to see you today I will be in touch with the labs once I have them Follow up with me in 6 months, sooner if you need me 

## 2024-02-19 NOTE — Assessment & Plan Note (Signed)
 Pending TSH, A1c, lipid panel.  Patient has lost weight since previous office visits

## 2024-02-19 NOTE — Assessment & Plan Note (Signed)
 Maintained on Adderall 10 to 20 mg daily.  Beneficial.  PDMP reviewed.  UDS updated today along with nonopioid controlled substance agreement.  Refills provided

## 2024-02-19 NOTE — Assessment & Plan Note (Signed)
 Pending urine microscopy to rule out microscopic hematuria does not qualify for LDCT currently

## 2024-02-19 NOTE — Assessment & Plan Note (Signed)
History of same pending lipid panel

## 2024-02-19 NOTE — Assessment & Plan Note (Signed)
 Discussed age-appropriate immunizations and screening exams.  Did review patient's personal, surgical, social, family histories.  Patient is up-to-date on all age-appropriate vaccinations he would like.  Patient declined flu vaccine today.  iFOB ordered for CRC screening.  PSA ordered today for prostate cancer screening.  Patient was given information at discharge about preventative healthcare maintenance with anticipatory guidance.

## 2024-02-20 LAB — LIPID PANEL
Cholesterol: 161 mg/dL (ref 0–200)
HDL: 37.9 mg/dL — ABNORMAL LOW (ref >39.00)
LDL Cholesterol: 90 mg/dL (ref 0–99)
NonHDL: 123.46
Total CHOL/HDL Ratio: 4
Triglycerides: 165 mg/dL — ABNORMAL HIGH (ref 0.0–149.0)
VLDL: 33 mg/dL (ref 0.0–40.0)

## 2024-02-20 LAB — URINALYSIS, MICROSCOPIC ONLY

## 2024-02-20 LAB — COMPREHENSIVE METABOLIC PANEL WITH GFR
ALT: 43 U/L (ref 0–53)
AST: 31 U/L (ref 0–37)
Albumin: 4.5 g/dL (ref 3.5–5.2)
Alkaline Phosphatase: 51 U/L (ref 39–117)
BUN: 15 mg/dL (ref 6–23)
CO2: 33 meq/L — ABNORMAL HIGH (ref 19–32)
Calcium: 9.6 mg/dL (ref 8.4–10.5)
Chloride: 102 meq/L (ref 96–112)
Creatinine, Ser: 0.86 mg/dL (ref 0.40–1.50)
GFR: 101.48 mL/min (ref >60.00)
Glucose, Bld: 87 mg/dL (ref 70–99)
Potassium: 3.9 meq/L (ref 3.5–5.1)
Sodium: 141 meq/L (ref 135–145)
Total Bilirubin: 0.3 mg/dL (ref 0.2–1.2)
Total Protein: 7.1 g/dL (ref 6.0–8.3)

## 2024-02-20 LAB — CBC WITH DIFFERENTIAL/PLATELET
Basophils Absolute: 0 K/uL (ref 0.0–0.1)
Basophils Relative: 0.6 % (ref 0.0–3.0)
Eosinophils Absolute: 0.1 K/uL (ref 0.0–0.7)
Eosinophils Relative: 1.9 % (ref 0.0–5.0)
HCT: 43 % (ref 39.0–52.0)
Hemoglobin: 14.7 g/dL (ref 13.0–17.0)
Lymphocytes Relative: 28.1 % (ref 12.0–46.0)
Lymphs Abs: 1.5 K/uL (ref 0.7–4.0)
MCHC: 34.2 g/dL (ref 30.0–36.0)
MCV: 85.9 fl (ref 78.0–100.0)
Monocytes Absolute: 0.7 K/uL (ref 0.1–1.0)
Monocytes Relative: 12.6 % — ABNORMAL HIGH (ref 3.0–12.0)
Neutro Abs: 3 K/uL (ref 1.4–7.7)
Neutrophils Relative %: 56.8 % (ref 43.0–77.0)
Platelets: 269 K/uL (ref 150.0–400.0)
RBC: 5.01 Mil/uL (ref 4.22–5.81)
RDW: 13.3 % (ref 11.5–15.5)
WBC: 5.3 K/uL (ref 4.0–10.5)

## 2024-02-20 LAB — PSA: PSA: 0.5 ng/mL (ref 0.10–4.00)

## 2024-02-20 LAB — HEMOGLOBIN A1C: Hgb A1c MFr Bld: 6.4 % (ref 4.6–6.5)

## 2024-02-20 LAB — TSH: TSH: 2.14 u[IU]/mL (ref 0.35–5.50)

## 2024-02-21 ENCOUNTER — Ambulatory Visit: Payer: Self-pay | Admitting: Nurse Practitioner

## 2024-02-21 DIAGNOSIS — R7303 Prediabetes: Secondary | ICD-10-CM | POA: Insufficient documentation

## 2024-02-23 LAB — DRUG MONITORING, PANEL 8 WITH CONFIRMATION, URINE
6 Acetylmorphine: NEGATIVE ng/mL (ref <10)
Alcohol Metabolites: NEGATIVE ng/mL (ref <500)
Amphetamine: 1267 ng/mL — ABNORMAL HIGH (ref <250)
Amphetamines: POSITIVE ng/mL — AB (ref <500)
Benzodiazepines: NEGATIVE ng/mL (ref <100)
Buprenorphine, Urine: NEGATIVE ng/mL (ref <5)
Cocaine Metabolite: NEGATIVE ng/mL (ref <150)
Creatinine: 96.4 mg/dL (ref >=20.0)
MDMA: NEGATIVE ng/mL (ref <500)
Marijuana Metabolite: NEGATIVE ng/mL (ref <20)
Methamphetamine: NEGATIVE ng/mL (ref <250)
Opiates: NEGATIVE ng/mL (ref <100)
Oxidant: NEGATIVE ug/mL (ref <200)
Oxycodone: NEGATIVE ng/mL (ref <100)
pH: 7.3 (ref 4.5–9.0)

## 2024-02-23 LAB — DM TEMPLATE

## 2024-02-27 ENCOUNTER — Other Ambulatory Visit: Payer: Self-pay

## 2024-02-27 DIAGNOSIS — Z1211 Encounter for screening for malignant neoplasm of colon: Secondary | ICD-10-CM

## 2024-03-01 LAB — FECAL OCCULT BLOOD, IMMUNOCHEMICAL: Fecal Occult Bld: NEGATIVE
